# Patient Record
Sex: Female | Born: 1950
Health system: Southern US, Community
[De-identification: ages and names within clinical notes are randomized; demographics above are authoritative.]

## PROBLEM LIST (undated history)

## (undated) DIAGNOSIS — E785 Hyperlipidemia, unspecified: Secondary | ICD-10-CM

## (undated) DIAGNOSIS — M1990 Unspecified osteoarthritis, unspecified site: Secondary | ICD-10-CM

## (undated) DIAGNOSIS — I10 Essential (primary) hypertension: Secondary | ICD-10-CM

## (undated) HISTORY — PX: COLONOSCOPY: SHX174

## (undated) HISTORY — PX: TUBAL LIGATION: SHX77

## (undated) HISTORY — PX: WISDOM TOOTH EXTRACTION: SHX21

---

## 1998-11-29 ENCOUNTER — Emergency Department (HOSPITAL_COMMUNITY): Admission: EM | Admit: 1998-11-29 | Discharge: 1998-11-29 | Payer: Self-pay | Admitting: Emergency Medicine

## 2000-05-06 ENCOUNTER — Emergency Department (HOSPITAL_COMMUNITY): Admission: EM | Admit: 2000-05-06 | Discharge: 2000-05-06 | Payer: Self-pay | Admitting: Emergency Medicine

## 2000-09-14 ENCOUNTER — Emergency Department (HOSPITAL_COMMUNITY): Admission: EM | Admit: 2000-09-14 | Discharge: 2000-09-14 | Payer: Self-pay | Admitting: Emergency Medicine

## 2003-06-30 ENCOUNTER — Emergency Department (HOSPITAL_COMMUNITY): Admission: EM | Admit: 2003-06-30 | Discharge: 2003-06-30 | Payer: Self-pay | Admitting: Emergency Medicine

## 2004-05-01 ENCOUNTER — Emergency Department (HOSPITAL_COMMUNITY): Admission: EM | Admit: 2004-05-01 | Discharge: 2004-05-01 | Payer: Self-pay | Admitting: Family Medicine

## 2008-11-29 ENCOUNTER — Inpatient Hospital Stay (HOSPITAL_COMMUNITY): Admission: AD | Admit: 2008-11-29 | Discharge: 2008-11-29 | Payer: Self-pay | Admitting: Obstetrics & Gynecology

## 2008-12-14 ENCOUNTER — Emergency Department (HOSPITAL_COMMUNITY): Admission: EM | Admit: 2008-12-14 | Discharge: 2008-12-14 | Payer: Self-pay | Admitting: Emergency Medicine

## 2009-08-21 ENCOUNTER — Emergency Department (HOSPITAL_COMMUNITY): Admission: EM | Admit: 2009-08-21 | Discharge: 2009-08-21 | Payer: Self-pay | Admitting: Family Medicine

## 2010-03-07 ENCOUNTER — Encounter: Payer: Self-pay | Admitting: Internal Medicine

## 2010-05-03 ENCOUNTER — Emergency Department (HOSPITAL_COMMUNITY)
Admission: EM | Admit: 2010-05-03 | Discharge: 2010-05-03 | Disposition: A | Discharge status: Home / Self Care | Payer: Self-pay | Attending: Emergency Medicine | Admitting: Emergency Medicine

## 2010-05-03 DIAGNOSIS — I10 Essential (primary) hypertension: Secondary | ICD-10-CM | POA: Insufficient documentation

## 2010-05-03 DIAGNOSIS — R197 Diarrhea, unspecified: Secondary | ICD-10-CM | POA: Insufficient documentation

## 2010-05-03 DIAGNOSIS — I2510 Atherosclerotic heart disease of native coronary artery without angina pectoris: Secondary | ICD-10-CM | POA: Insufficient documentation

## 2010-05-03 DIAGNOSIS — R112 Nausea with vomiting, unspecified: Secondary | ICD-10-CM | POA: Insufficient documentation

## 2010-05-03 DIAGNOSIS — E7800 Pure hypercholesterolemia, unspecified: Secondary | ICD-10-CM | POA: Insufficient documentation

## 2010-05-03 LAB — CBC
HCT: 43.1 % (ref 36.0–46.0)
MCH: 26.8 pg (ref 26.0–34.0)
MCV: 81.8 fL (ref 78.0–100.0)
Platelets: 170 10*3/uL (ref 150–400)
RDW: 13.6 % (ref 11.5–15.5)

## 2010-05-03 LAB — DIFFERENTIAL
Eosinophils Absolute: 0.1 10*3/uL (ref 0.0–0.7)
Eosinophils Relative: 2 % (ref 0–5)
Lymphs Abs: 2.4 10*3/uL (ref 0.7–4.0)
Monocytes Absolute: 0.4 10*3/uL (ref 0.1–1.0)
Monocytes Relative: 6 % (ref 3–12)

## 2010-05-03 LAB — URINALYSIS, ROUTINE W REFLEX MICROSCOPIC
Glucose, UA: NEGATIVE mg/dL
Protein, ur: NEGATIVE mg/dL
Specific Gravity, Urine: 1.02 (ref 1.005–1.030)
Urobilinogen, UA: 1 mg/dL (ref 0.0–1.0)

## 2010-05-03 LAB — COMPREHENSIVE METABOLIC PANEL
AST: 57 U/L — ABNORMAL HIGH (ref 0–37)
Albumin: 4.1 g/dL (ref 3.5–5.2)
BUN: 7 mg/dL (ref 6–23)
Calcium: 9.3 mg/dL (ref 8.4–10.5)
Creatinine, Ser: 0.78 mg/dL (ref 0.4–1.2)
GFR calc non Af Amer: >60 mL/min (ref >60)

## 2010-05-03 LAB — URINE MICROSCOPIC-ADD ON

## 2010-05-20 LAB — URINALYSIS, ROUTINE W REFLEX MICROSCOPIC
Bilirubin Urine: NEGATIVE
Glucose, UA: NEGATIVE mg/dL
Hgb urine dipstick: NEGATIVE
Protein, ur: NEGATIVE mg/dL
Specific Gravity, Urine: 1.005 (ref 1.005–1.030)
Urobilinogen, UA: 0.2 mg/dL (ref 0.0–1.0)

## 2010-05-20 LAB — URINE MICROSCOPIC-ADD ON

## 2010-05-20 LAB — WET PREP, GENITAL: Yeast Wet Prep HPF POC: NONE SEEN

## 2010-11-03 ENCOUNTER — Other Ambulatory Visit: Payer: Self-pay | Admitting: Internal Medicine

## 2010-11-03 DIAGNOSIS — Z1231 Encounter for screening mammogram for malignant neoplasm of breast: Secondary | ICD-10-CM

## 2010-12-09 ENCOUNTER — Ambulatory Visit: Payer: Self-pay

## 2011-01-13 ENCOUNTER — Inpatient Hospital Stay: Admission: RE | Admit: 2011-01-13 | Payer: Self-pay | Source: Ambulatory Visit

## 2011-11-01 ENCOUNTER — Emergency Department (HOSPITAL_COMMUNITY)
Admission: EM | Admit: 2011-11-01 | Discharge: 2011-11-01 | Disposition: A | Discharge status: Home / Self Care | Payer: BC Managed Care – PPO | Source: Home / Self Care | Attending: Family Medicine | Admitting: Family Medicine

## 2011-11-01 ENCOUNTER — Encounter (HOSPITAL_COMMUNITY): Payer: Self-pay | Admitting: *Deleted

## 2011-11-01 ENCOUNTER — Emergency Department (INDEPENDENT_AMBULATORY_CARE_PROVIDER_SITE_OTHER): Payer: BC Managed Care – PPO

## 2011-11-01 DIAGNOSIS — M25511 Pain in right shoulder: Secondary | ICD-10-CM

## 2011-11-01 DIAGNOSIS — M25519 Pain in unspecified shoulder: Secondary | ICD-10-CM

## 2011-11-01 MED ORDER — MELOXICAM 7.5 MG PO TABS: 7.5000 mg | ORAL_TABLET | Freq: Every day | ORAL | Status: DC

## 2011-11-01 NOTE — ED Notes (Signed)
Pt  Reports  r  Shoulder  Pain  Off  And  On  For  Several  Months  Became  Worse  Last  Pm  She  Reports  The pain  Is  Pretty  Much  Constant     She  denys any  specefic  Injury        She    Reports  The pain  Is  releived  Sometimes  By otc  Motrin     She  Is  Sitting  Upright on  Exam table  Speaking     In  Complete sentances                Appears  In  No  Severe  Distress

## 2011-11-01 NOTE — ED Provider Notes (Signed)
History     CSN: [id number]  Arrival date & time 11/01/11  1641   None     Chief Complaint  Patient presents with  . Shoulder Pain    (Consider location/radiation/quality/duration/timing/severity/associated sxs/prior treatment) Patient is a 61 y.o. female presenting with shoulder pain. The history is provided by the patient.  Shoulder Pain This is a new problem. The current episode started 2 days ago. The problem occurs daily. The problem has been gradually worsening. The symptoms are relieved by NSAIDs. The treatment provided no relief.   [name] is a 61 y.o. female who complains of left shoulder achy pain for 2 days. Mechanism of injury: none known, works at [employer] in the [employer] at [employer].  Reports onset of gradual pain.  Pain is worse with movement; noted to be worse during day, denies change in arm function or strength.  Applied heat to area and took advil with minimal relief.  Symptoms have been increasingly worse since that time radiating down upper arm, denies paresthesias. No prior history of related problems with shoulder.  Patient denies neuromuscular disorders.     History reviewed. No pertinent past medical history.  Past Surgical History  Procedure Date  . Cesarean section     No family history on file.  History  Substance Use Topics  . Smoking status: Never Smoker   . Smokeless tobacco: Not on file  . Alcohol Use: Yes     rarely    OB History    Grav Para Term Preterm Abortions TAB SAB Ect Mult Living                  Review of Systems  Constitutional: Negative.   Respiratory: Negative.   Cardiovascular: Negative.   Musculoskeletal: Positive for arthralgias. Negative for myalgias, back pain, joint swelling and gait problem.  Skin: Negative.   Neurological: Negative.     Allergies  Review of patient's allergies indicates not on file.  Home Medications   Current Outpatient Rx  Name Route Sig Dispense Refill  . ADVIL PO  Oral Take by mouth.    . MELOXICAM 7.5 MG PO TABS Oral Take 1 tablet (7.5 mg total) by mouth daily. 30 tablet 1    BP 139/81  Pulse 80  Temp 99.1 F (37.3 C) (Oral)  Resp 18  SpO2 100%  Physical Exam  Nursing note and vitals reviewed. Constitutional: She is oriented to person, place, and time. Vital signs are normal. She appears well-developed and well-nourished. She is active and cooperative.  HENT:  Head: Normocephalic.  Eyes: Conjunctivae normal are normal. Pupils are equal, round, and reactive to light. No scleral icterus.  Neck: Trachea normal. Neck supple.  Cardiovascular: Normal rate, regular rhythm and normal heart sounds.   Pulmonary/Chest: Effort normal and breath sounds normal. No respiratory distress.  Musculoskeletal:       Right shoulder: She exhibits tenderness.       Left shoulder: Normal.       Right elbow: Normal.      Right wrist: Normal.       Cervical back: Normal.       Back:       right shoulder with ROM normal, Drop test normal, Tenderness over right trapezius and rhomboid muscle group, shoulder joint tenderness, clavicle NT, A/C joint tender, scapula tender, proximal humerus tender, shoulder joint tender; Motor strength normal, Sensation intact over deltoid region, distal sensation intact in fingers, no instability with abduction/external rotation.  Lymphadenopathy:    She has no cervical adenopathy.  Neurological: She is alert and oriented to person, place, and time. She has normal strength. No cranial nerve deficit or sensory deficit. GCS eye subscore is 4. GCS verbal subscore is 5. GCS motor subscore is 6.  Skin: Skin is warm and dry.  Psychiatric: She has a normal mood and affect. Her speech is normal and behavior is normal. Judgment and thought content normal. Cognition and memory are normal.    ED Course  Procedures (including critical care time)  Labs Reviewed - No data to display Dg Shoulder Right  11/01/2011  *RADIOLOGY REPORT*  Clinical  Data: Right shoulder pain.  RIGHT SHOULDER - 2+ VIEW  Comparison: None.  Findings: No fracture, dislocation, or acute bony finding. The acromial undersurface is type 2 (curved).  IMPRESSION:  1.  No significant abnormality identified.   Original Report Authenticated By: Dellia Cloud, M.D.      1. Right shoulder pain       MDM  Take medication as prescribed, no heavy lifting for one week.        Johnsie Kindred, NP 11/03/11 1218

## 2011-11-11 NOTE — ED Provider Notes (Signed)
Medical screening examination/treatment/procedure(s) were performed by resident physician or non-physician practitioner and as supervising physician I was immediately available for consultation/collaboration.   Barkley Bruns MD.    Linna Hoff, MD 11/11/11 [phone]

## 2012-04-21 ENCOUNTER — Encounter (HOSPITAL_COMMUNITY): Payer: Self-pay

## 2012-04-21 ENCOUNTER — Emergency Department (HOSPITAL_COMMUNITY)
Admission: EM | Admit: 2012-04-21 | Discharge: 2012-04-21 | Disposition: A | Discharge status: Home / Self Care | Payer: BC Managed Care – PPO | Attending: Emergency Medicine | Admitting: Emergency Medicine

## 2012-04-21 DIAGNOSIS — M129 Arthropathy, unspecified: Secondary | ICD-10-CM | POA: Insufficient documentation

## 2012-04-21 DIAGNOSIS — Z79899 Other long term (current) drug therapy: Secondary | ICD-10-CM | POA: Insufficient documentation

## 2012-04-21 DIAGNOSIS — H9319 Tinnitus, unspecified ear: Secondary | ICD-10-CM | POA: Insufficient documentation

## 2012-04-21 DIAGNOSIS — Z760 Encounter for issue of repeat prescription: Secondary | ICD-10-CM | POA: Insufficient documentation

## 2012-04-21 DIAGNOSIS — Z8659 Personal history of other mental and behavioral disorders: Secondary | ICD-10-CM | POA: Insufficient documentation

## 2012-04-21 DIAGNOSIS — I10 Essential (primary) hypertension: Secondary | ICD-10-CM | POA: Insufficient documentation

## 2012-04-21 HISTORY — DX: Essential (primary) hypertension: I10

## 2012-04-21 HISTORY — DX: Unspecified osteoarthritis, unspecified site: M19.90

## 2012-04-21 MED ORDER — HYDROCHLOROTHIAZIDE 25 MG PO TABS: 25.0000 mg | ORAL_TABLET | Freq: Every day | ORAL | Status: DC

## 2012-04-21 NOTE — ED Provider Notes (Signed)
History     CSN: [id number]  Arrival date & time 04/21/12  0102   First MD Initiated Contact with Patient 04/21/12 [phone]      Chief Complaint  Patient presents with  . Hypertension    (Consider location/radiation/quality/duration/timing/severity/associated sxs/prior treatment) HPI  A 62 year old female with history of hypertension, and arthritis presents for request of medication refilled. Patient reports she stopped taking depression medication for nearly 2 months because "I do not want to take it anymore".  When asked why, pt reports she likes to stay healthy and not dependent on medication.  She has tried diet and exercise.  She's here today because she has ringing in her L ear.  Sts she used to have ringing in ear prior to being on HCTZ.  Once she was on the medication for over a year, her ringing stop.  It has resumed within the past month.  Ringing is intermittent, lasting for mins.  Endorse mild intermittent headache affecting forehead.  NO active headache or ringing ear. Sts she hasn't been exercise or eat as healthy as she wants. Denies fever, chills, denies vision changes, chest pain, shortness of breath, back pain, abdominal pain, dysuria, numbness, weakness. She denies calf pain or leg swelling. She denies taking any NSAIDs for a prolonged period of time. She denies any other medication changes. She is a nonsmoker.  Past Medical History  Diagnosis Date  . Hypertension   . Arthritis     History reviewed. No pertinent past surgical history.  No family history on file.  History  Substance Use Topics  . Smoking status: Never Smoker   . Smokeless tobacco: Not on file  . Alcohol Use: Yes     Comment: rarely    OB History   Grav Para Term Preterm Abortions TAB SAB Ect Mult Living                  Review of Systems  Constitutional:       10 Systems reviewed and all are negative for acute change except as noted in the HPI.     Allergies  Review of patient's  allergies indicates no known allergies.  Home Medications   Current Outpatient Rx  Name  Route  Sig  Dispense  Refill  . Ibuprofen (ADVIL PO)   Oral   Take by mouth.         . meloxicam (MOBIC) 7.5 MG tablet   Oral   Take 1 tablet (7.5 mg total) by mouth daily.   30 tablet   1     BP 157/94  Pulse 71  Temp(Src) 98 F (36.7 C) (Oral)  Resp 20  SpO2 99%  Physical Exam  Nursing note and vitals reviewed. Constitutional: She appears well-developed and well-nourished. No distress.  Awake, alert, nontoxic appearance  HENT:  Head: Atraumatic.  Right Ear: External ear normal.  Left Ear: External ear normal.  Nose: Nose normal.  Mouth/Throat: Oropharynx is clear and moist.  Eyes: Conjunctivae are normal. Right eye exhibits no discharge. Left eye exhibits no discharge.  Neck: Neck supple.  Cardiovascular: Normal rate, regular rhythm and intact distal pulses.  Exam reveals no gallop and no friction rub.   No murmur heard. Pulmonary/Chest: Effort normal. No respiratory distress. She exhibits no tenderness.  Abdominal: Soft. There is no tenderness. There is no rebound.  Musculoskeletal: Normal range of motion. She exhibits no edema and no tenderness.  ROM appears intact, no obvious focal weakness  Neurological:  Mental status and  motor strength appears intact  Skin: No rash noted.  Psychiatric: She has a normal mood and affect.    ED Course  Procedures (including critical care time)  Labs Reviewed - No data to display No results found.   1. Encounter for medication refill     10:04 AM Patient is here for medication refill, hydrochlorothiazide 25 mg. She does complain of intermittent ringing in ear however none recently and her ear exam is unremarkable. Her blood pressure today is 157/94. I do not suspect hypertensive urgency or emergency at this time. She is alert and oriented without any significant discomfort. Will refill her hydrochlorothiazide and recommend patient  to followup closely with the PCP for further management of her disease. I encourage continue with diet and exercise.  BP 157/94  Pulse 71  Temp(Src) 98 F (36.7 C) (Oral)  Resp 20  SpO2 99%   MDM          Fayrene Helper, PA-C 04/21/12 1009

## 2012-04-21 NOTE — ED Notes (Signed)
Pt states she stopped taking her BP meds 6 to 8 weeks ago because she doesn't want to take them anymore.  Pt states that since she has stopped taking her BP meds she has noticed intermittent ear ringing.  Pt c/o high BP.  Pt's BP upon arrival here 157/94.

## 2012-04-21 NOTE — ED Provider Notes (Signed)
Medical screening examination/treatment/procedure(s) were performed by non-physician practitioner and as supervising physician I was immediately available for consultation/collaboration.  Doug Sou, MD 04/21/12 1650

## 2013-03-15 ENCOUNTER — Other Ambulatory Visit: Payer: Self-pay | Admitting: Obstetrics and Gynecology

## 2013-03-19 ENCOUNTER — Encounter (HOSPITAL_COMMUNITY): Payer: Self-pay | Admitting: Pharmacist

## 2013-03-27 ENCOUNTER — Encounter (HOSPITAL_COMMUNITY): Payer: Self-pay

## 2013-03-27 ENCOUNTER — Other Ambulatory Visit: Payer: Self-pay

## 2013-03-27 ENCOUNTER — Encounter (HOSPITAL_COMMUNITY)
Admission: RE | Admit: 2013-03-27 | Discharge: 2013-03-27 | Disposition: A | Discharge status: Home / Self Care | Payer: BC Managed Care – PPO | Source: Ambulatory Visit | Attending: Obstetrics and Gynecology | Admitting: Obstetrics and Gynecology

## 2013-03-27 ENCOUNTER — Other Ambulatory Visit: Payer: Self-pay | Admitting: Obstetrics and Gynecology

## 2013-03-27 DIAGNOSIS — Z01812 Encounter for preprocedural laboratory examination: Secondary | ICD-10-CM | POA: Insufficient documentation

## 2013-03-27 DIAGNOSIS — Z01810 Encounter for preprocedural cardiovascular examination: Secondary | ICD-10-CM | POA: Insufficient documentation

## 2013-03-27 HISTORY — DX: Hyperlipidemia, unspecified: E78.5

## 2013-03-27 LAB — BASIC METABOLIC PANEL
BUN: 14 mg/dL (ref 6–23)
CHLORIDE: 100 meq/L (ref 96–112)
CO2: 30 mEq/L (ref 19–32)
Calcium: 9.2 mg/dL (ref 8.4–10.5)
Creatinine, Ser: 0.63 mg/dL (ref 0.50–1.10)
GFR calc Af Amer: >90 mL/min (ref >90)
GLUCOSE: 93 mg/dL (ref 70–99)
POTASSIUM: 3.5 meq/L — AB (ref 3.7–5.3)
Sodium: 140 mEq/L (ref 137–147)

## 2013-03-27 LAB — CBC
HEMATOCRIT: 38.6 % (ref 36.0–46.0)
HEMOGLOBIN: 12.7 g/dL (ref 12.0–15.0)
MCH: 26.3 pg (ref 26.0–34.0)
MCHC: 32.9 g/dL (ref 30.0–36.0)
MCV: 80.1 fL (ref 78.0–100.0)
Platelets: 195 10*3/uL (ref 150–400)
RBC: 4.82 MIL/uL (ref 3.87–5.11)
RDW: 14.3 % (ref 11.5–15.5)
WBC: 6.4 10*3/uL (ref 4.0–10.5)

## 2013-03-27 NOTE — Patient Instructions (Signed)
   Your procedure is scheduled on: Monday, Feb 16  Enter through the [employer] [facility] at: [place] up the phone at the desk and dial [phone] and inform us of your arrival.  Please call this number if you have any problems the morning of surgery: [phone]  Remember: Do not eat food after midnight: Sunday Do not drink clear liquids after:  9 AM Monday, day of surgery Take these medicines the morning of surgery with a SIP OF WATER:  HCTZ, crestor  Do not wear jewelry, make-up, or FINGER nail polish No metal in your hair or on your body. Do not wear lotions, powders, perfumes.  You may wear deodorant.  Do not bring valuables to the hospital. Contacts, dentures or bridgework may not be worn into surgery.  Leave suitcase in the car. After Surgery it may be brought to your room. For patients being admitted to the hospital, checkout time is 11:00am the day of discharge.  Home with daughter Laurenashley Viar cell [phone]

## 2013-03-31 NOTE — H&P (Signed)
[name], [name]             ACCOUNT NO.:  [id number]  MEDICAL RECORD NO.:  [id number]  LOCATION:  SDC                           FACILITY:  [facility]  PHYSICIAN:  Lovenia Kim, M.D.DATE OF BIRTH:  Mar 22, 1950  DATE OF ADMISSION:  03/27/2013 DATE OF DISCHARGE:  03/27/2013                             HISTORY & PHYSICAL   CHIEF COMPLAINT:  Symptomatic pelvic relaxation and hyperplasia without atypia.  HISTORY OF PRESENT ILLNESS:  This is a 63 year old African American female, G6 P5 presents with aforementioned indications for surgery.  ALLERGIES:  She has no known drug allergies.  MEDICATIONS:  Estrace cream, hydrochlorothiazide, Crestor, and vitamin D3.  PAST MEDICAL HISTORY:  Hypertension, hypercholesterolemia, uterine prolapse, cystocele.  PREGNANCY HISTORY:  Five vaginal deliveries, 1 abortion.  PAST MEDICAL HISTORY:  Tubal ligation.  FAMILY HISTORY:  Diabetes, heart disease, hypertension.  PHYSICAL EXAMINATION:  GENERAL:  A well-developed, well-nourished, African American female, in no acute distress. HEENT:  Normal. NECK:  Supple.  Full range of motion. LUNGS:  Clear. HEART:  Regular rate and rhythm. ABDOMEN:  Soft, nontender. PELVIC:  Reveals the uterus to be anteflexed and slightly globular in nature with no adnexal masses appreciated.Gr one uterine descensus and grade 1-2 cystocele. EXTREMITIES:  No cords. NEUROLOGIC:  Nonfocal. SKIN:  Intact.  LABORATORY DATA:  Reveals an ultrasound which noted a 20 mm endometrium; therefore, in the absence of postmenopausal bleeding an endometrial biopsy was performed with known simple hyperplasia with no atypia.  IMPRESSION: 1. Symptomatic pelvic prolapse with cystocele, enterocele, and uterine     Prolapse. Failed pessary trial. 2. Simple endometrial hyperplasia with no atypia.  PLAN:  Proceed with total vaginal hysterectomy, anterior colporrhaphy, enterocele repair.  Risks of anesthesia, infection, bleeding, injury  to surrounding organs with need for repair is discussed, delayed versus immediate complications to include bowel, bladder injury noted.  The patient acknowledges and wishes to proceed with surgery.     Lovenia Kim, M.D.     RJT/MEDQ  D:  03/31/2013  T:  03/31/2013  Job:  [id number]

## 2013-04-01 ENCOUNTER — Encounter (HOSPITAL_COMMUNITY): Payer: BC Managed Care – PPO | Admitting: Anesthesiology

## 2013-04-01 ENCOUNTER — Ambulatory Visit (HOSPITAL_COMMUNITY)
Admission: RE | Admit: 2013-04-01 | Discharge: 2013-04-02 | Disposition: A | Discharge status: Home / Self Care | Payer: BC Managed Care – PPO | Source: Ambulatory Visit | Attending: Obstetrics and Gynecology | Admitting: Obstetrics and Gynecology

## 2013-04-01 ENCOUNTER — Encounter (HOSPITAL_COMMUNITY)
Admission: RE | Disposition: A | Discharge status: Home / Self Care | Payer: Self-pay | Source: Ambulatory Visit | Attending: Obstetrics and Gynecology

## 2013-04-01 ENCOUNTER — Ambulatory Visit (HOSPITAL_COMMUNITY): Payer: BC Managed Care – PPO | Admitting: Anesthesiology

## 2013-04-01 ENCOUNTER — Encounter (HOSPITAL_COMMUNITY): Payer: Self-pay | Admitting: Anesthesiology

## 2013-04-01 DIAGNOSIS — N8189 Other female genital prolapse: Secondary | ICD-10-CM | POA: Diagnosis present

## 2013-04-01 DIAGNOSIS — N72 Inflammatory disease of cervix uteri: Secondary | ICD-10-CM | POA: Insufficient documentation

## 2013-04-01 DIAGNOSIS — D251 Intramural leiomyoma of uterus: Secondary | ICD-10-CM | POA: Insufficient documentation

## 2013-04-01 DIAGNOSIS — N815 Vaginal enterocele: Secondary | ICD-10-CM | POA: Insufficient documentation

## 2013-04-01 DIAGNOSIS — I10 Essential (primary) hypertension: Secondary | ICD-10-CM | POA: Insufficient documentation

## 2013-04-01 DIAGNOSIS — N8000 Endometriosis of the uterus, unspecified: Secondary | ICD-10-CM | POA: Insufficient documentation

## 2013-04-01 DIAGNOSIS — N841 Polyp of cervix uteri: Secondary | ICD-10-CM | POA: Insufficient documentation

## 2013-04-01 DIAGNOSIS — N840 Polyp of corpus uteri: Secondary | ICD-10-CM | POA: Insufficient documentation

## 2013-04-01 DIAGNOSIS — N812 Incomplete uterovaginal prolapse: Principal | ICD-10-CM | POA: Insufficient documentation

## 2013-04-01 DIAGNOSIS — N318 Other neuromuscular dysfunction of bladder: Secondary | ICD-10-CM | POA: Insufficient documentation

## 2013-04-01 HISTORY — PX: CYSTOCELE REPAIR: SHX163

## 2013-04-01 HISTORY — PX: VAGINAL HYSTERECTOMY: SHX2639

## 2013-04-01 HISTORY — PX: RECTOCELE REPAIR: SHX761

## 2013-04-01 SURGERY — COLPORRHAPHY, ANTERIOR, FOR CYSTOCELE REPAIR
Anesthesia: General

## 2013-04-01 MED ORDER — LACTATED RINGERS IV SOLN
INTRAVENOUS | Status: DC
  Administered 2013-04-01 (×2): via INTRAVENOUS

## 2013-04-01 MED ORDER — LABETALOL HCL 5 MG/ML IV SOLN
INTRAVENOUS | Status: AC
  Filled 2013-04-01: qty 4

## 2013-04-01 MED ORDER — NEOSTIGMINE METHYLSULFATE 1 MG/ML IJ SOLN
INTRAMUSCULAR | Status: DC | PRN
  Administered 2013-04-01: 2 mg via INTRAVENOUS

## 2013-04-01 MED ORDER — SODIUM CHLORIDE 0.9 % IJ SOLN
9.0000 mL | INTRAMUSCULAR | Status: DC | PRN
Start: 2013-04-01 — End: 2013-04-02

## 2013-04-01 MED ORDER — LABETALOL HCL 5 MG/ML IV SOLN
INTRAVENOUS | Status: DC | PRN
  Administered 2013-04-01: 5 mg via INTRAVENOUS

## 2013-04-01 MED ORDER — ONDANSETRON HCL 4 MG/2ML IJ SOLN
INTRAMUSCULAR | Status: AC
  Filled 2013-04-01: qty 2

## 2013-04-01 MED ORDER — DIPHENHYDRAMINE HCL 12.5 MG/5ML PO ELIX
12.5000 mg | ORAL_SOLUTION | Freq: Four times a day (QID) | ORAL | Status: DC | PRN
Start: 2013-04-01 — End: 2013-04-02

## 2013-04-01 MED ORDER — HYDROCHLOROTHIAZIDE 12.5 MG PO CAPS
12.5000 mg | ORAL_CAPSULE | Freq: Every day | ORAL | Status: DC
  Administered 2013-04-02: 12.5 mg via ORAL
  Filled 2013-04-01: qty 1

## 2013-04-01 MED ORDER — METOCLOPRAMIDE HCL 5 MG/ML IJ SOLN
10.0000 mg | Freq: Once | INTRAMUSCULAR | Status: AC | PRN
  Administered 2013-04-01: 10 mg via INTRAVENOUS

## 2013-04-01 MED ORDER — HYDROMORPHONE HCL PF 1 MG/ML IJ SOLN
INTRAMUSCULAR | Status: AC
  Administered 2013-04-01: 0.25 mg via INTRAVENOUS
  Filled 2013-04-01: qty 1

## 2013-04-01 MED ORDER — HYDROMORPHONE 0.3 MG/ML IV SOLN
INTRAVENOUS | Status: DC
  Administered 2013-04-01: 18:00:00 via INTRAVENOUS
  Administered 2013-04-01: 0.4 mg via INTRAVENOUS
  Administered 2013-04-02: 0.399 mg via INTRAVENOUS
  Administered 2013-04-02: 0.4 mg via INTRAVENOUS
  Filled 2013-04-01: qty 25

## 2013-04-01 MED ORDER — PROPOFOL 10 MG/ML IV BOLUS
INTRAVENOUS | Status: DC | PRN
  Administered 2013-04-01: 120 mg via INTRAVENOUS
  Administered 2013-04-01: 30 mg via INTRAVENOUS

## 2013-04-01 MED ORDER — MIDAZOLAM HCL 2 MG/2ML IJ SOLN
INTRAMUSCULAR | Status: DC | PRN
  Administered 2013-04-01: 1 mg via INTRAVENOUS

## 2013-04-01 MED ORDER — TRAMADOL HCL 50 MG PO TABS: 50.0000 mg | ORAL_TABLET | Freq: Four times a day (QID) | ORAL | Status: DC | PRN

## 2013-04-01 MED ORDER — KETOROLAC TROMETHAMINE 30 MG/ML IJ SOLN
INTRAMUSCULAR | Status: AC
  Filled 2013-04-01: qty 1

## 2013-04-01 MED ORDER — FENTANYL CITRATE 0.05 MG/ML IJ SOLN
INTRAMUSCULAR | Status: AC
  Filled 2013-04-01: qty 2

## 2013-04-01 MED ORDER — ONDANSETRON HCL 4 MG/2ML IJ SOLN
4.0000 mg | Freq: Once | INTRAMUSCULAR | Status: AC
  Administered 2013-04-01: 4 mg via INTRAVENOUS

## 2013-04-01 MED ORDER — VASOPRESSIN 20 UNIT/ML IJ SOLN
INTRAVENOUS | Status: DC | PRN
  Administered 2013-04-01: 14:00:00 via INTRAMUSCULAR

## 2013-04-01 MED ORDER — NEOSTIGMINE METHYLSULFATE 1 MG/ML IJ SOLN
INTRAMUSCULAR | Status: AC
  Filled 2013-04-01: qty 1

## 2013-04-01 MED ORDER — DEXAMETHASONE SODIUM PHOSPHATE 10 MG/ML IJ SOLN
INTRAMUSCULAR | Status: DC | PRN
  Administered 2013-04-01: 10 mg via INTRAVENOUS

## 2013-04-01 MED ORDER — LIDOCAINE HCL (CARDIAC) 20 MG/ML IV SOLN
INTRAVENOUS | Status: DC | PRN
  Administered 2013-04-01: 50 mg via INTRAVENOUS

## 2013-04-01 MED ORDER — ONDANSETRON HCL 4 MG/2ML IJ SOLN: 4.0000 mg | Freq: Four times a day (QID) | INTRAMUSCULAR | Status: DC | PRN

## 2013-04-01 MED ORDER — ONDANSETRON HCL 4 MG/2ML IJ SOLN
INTRAMUSCULAR | Status: DC | PRN
  Administered 2013-04-01: 4 mg via INTRAVENOUS

## 2013-04-01 MED ORDER — NALOXONE HCL 0.4 MG/ML IJ SOLN: 0.4000 mg | INTRAMUSCULAR | Status: DC | PRN

## 2013-04-01 MED ORDER — HYDROMORPHONE HCL PF 1 MG/ML IJ SOLN
0.2500 mg | INTRAMUSCULAR | Status: DC | PRN
  Administered 2013-04-01: 0.5 mg via INTRAVENOUS
  Administered 2013-04-01 (×2): 0.25 mg via INTRAVENOUS
  Administered 2013-04-01: 0.5 mg via INTRAVENOUS

## 2013-04-01 MED ORDER — PROPOFOL 10 MG/ML IV EMUL
INTRAVENOUS | Status: AC
  Filled 2013-04-01: qty 20

## 2013-04-01 MED ORDER — ESTRADIOL 0.1 MG/GM VA CREA
TOPICAL_CREAM | VAGINAL | Status: AC
  Filled 2013-04-01: qty 42.5

## 2013-04-01 MED ORDER — CEFAZOLIN SODIUM-DEXTROSE 2-3 GM-% IV SOLR
2.0000 g | INTRAVENOUS | Status: AC
  Administered 2013-04-01: 2 g via INTRAVENOUS

## 2013-04-01 MED ORDER — ONDANSETRON HCL 4 MG/2ML IJ SOLN
INTRAMUSCULAR | Status: AC
  Administered 2013-04-01: 4 mg via INTRAVENOUS
  Filled 2013-04-01: qty 2

## 2013-04-01 MED ORDER — DEXTROSE IN LACTATED RINGERS 5 % IV SOLN
INTRAVENOUS | Status: DC
  Administered 2013-04-01 – 2013-04-02 (×2): via INTRAVENOUS

## 2013-04-01 MED ORDER — FENTANYL CITRATE 0.05 MG/ML IJ SOLN
INTRAMUSCULAR | Status: DC | PRN
  Administered 2013-04-01 (×3): 50 ug via INTRAVENOUS
  Administered 2013-04-01: 150 ug via INTRAVENOUS
  Administered 2013-04-01: 100 ug via INTRAVENOUS

## 2013-04-01 MED ORDER — SODIUM CHLORIDE 0.9 % IJ SOLN
INTRAMUSCULAR | Status: AC
  Filled 2013-04-01: qty 100

## 2013-04-01 MED ORDER — GLYCOPYRROLATE 0.2 MG/ML IJ SOLN
INTRAMUSCULAR | Status: AC
  Filled 2013-04-01: qty 2

## 2013-04-01 MED ORDER — METOCLOPRAMIDE HCL 5 MG/ML IJ SOLN
INTRAMUSCULAR | Status: AC
  Administered 2013-04-01: 10 mg
  Filled 2013-04-01: qty 2

## 2013-04-01 MED ORDER — KETOROLAC TROMETHAMINE 30 MG/ML IJ SOLN
INTRAMUSCULAR | Status: DC | PRN
  Administered 2013-04-01: 30 mg via INTRAVENOUS

## 2013-04-01 MED ORDER — GLYCOPYRROLATE 0.2 MG/ML IJ SOLN
INTRAMUSCULAR | Status: DC | PRN
  Administered 2013-04-01: 0.4 mg via INTRAVENOUS

## 2013-04-01 MED ORDER — ROCURONIUM BROMIDE 100 MG/10ML IV SOLN
INTRAVENOUS | Status: DC | PRN
  Administered 2013-04-01: 30 mg via INTRAVENOUS

## 2013-04-01 MED ORDER — DIPHENHYDRAMINE HCL 50 MG/ML IJ SOLN: 12.5000 mg | Freq: Four times a day (QID) | INTRAMUSCULAR | Status: DC | PRN

## 2013-04-01 MED ORDER — HYDROMORPHONE HCL PF 1 MG/ML IJ SOLN
INTRAMUSCULAR | Status: AC
  Filled 2013-04-01: qty 1

## 2013-04-01 MED ORDER — OXYCODONE-ACETAMINOPHEN 5-325 MG PO TABS
1.0000 | ORAL_TABLET | ORAL | Status: DC | PRN
  Administered 2013-04-02: 1 via ORAL
  Filled 2013-04-01: qty 1

## 2013-04-01 MED ORDER — FENTANYL CITRATE 0.05 MG/ML IJ SOLN
INTRAMUSCULAR | Status: AC
  Filled 2013-04-01: qty 5

## 2013-04-01 MED ORDER — LIDOCAINE HCL (CARDIAC) 20 MG/ML IV SOLN
INTRAVENOUS | Status: AC
  Filled 2013-04-01: qty 5

## 2013-04-01 MED ORDER — MIDAZOLAM HCL 2 MG/2ML IJ SOLN
INTRAMUSCULAR | Status: AC
  Filled 2013-04-01: qty 2

## 2013-04-01 MED ORDER — VASOPRESSIN 20 UNIT/ML IJ SOLN
INTRAMUSCULAR | Status: AC
  Filled 2013-04-01: qty 1

## 2013-04-01 MED ORDER — DEXAMETHASONE SODIUM PHOSPHATE 10 MG/ML IJ SOLN
INTRAMUSCULAR | Status: AC
  Filled 2013-04-01: qty 1

## 2013-04-01 MED ORDER — MEPERIDINE HCL 25 MG/ML IJ SOLN: 6.2500 mg | INTRAMUSCULAR | Status: DC | PRN

## 2013-04-01 SURGICAL SUPPLY — 33 items
CANISTER SUCT 3000ML (MISCELLANEOUS) ×2 IMPLANT
CLOTH BEACON ORANGE TIMEOUT ST (SAFETY) ×2 IMPLANT
CONT PATH 16OZ SNAP LID 3702 (MISCELLANEOUS) IMPLANT
CONTAINER PREFILL 10% NBF 60ML (FORM) IMPLANT
DECANTER SPIKE VIAL GLASS SM (MISCELLANEOUS) ×2 IMPLANT
DRSG TELFA 3X8 NADH (GAUZE/BANDAGES/DRESSINGS) ×2 IMPLANT
ELECT NDL TIP 2.8 STRL (NEEDLE) IMPLANT
ELECT NEEDLE TIP 2.8 STRL (NEEDLE) IMPLANT
GLOVE BIO SURGEON STRL SZ 6.5 (GLOVE) ×2 IMPLANT
GLOVE BIO SURGEON STRL SZ7.5 (GLOVE) ×2 IMPLANT
GLOVE BIOGEL PI IND STRL 6.5 (GLOVE) ×1 IMPLANT
GLOVE BIOGEL PI IND STRL 7.0 (GLOVE) IMPLANT
GLOVE BIOGEL PI INDICATOR 6.5 (GLOVE) ×1
GLOVE BIOGEL PI INDICATOR 7.0 (GLOVE) ×1
GOWN PREVENTION PLUS XLARGE (GOWN DISPOSABLE) ×2 IMPLANT
GOWN STRL REIN XL XLG (GOWN DISPOSABLE) ×8 IMPLANT
NDL SPNL 22GX3.5 QUINCKE BK (NEEDLE) IMPLANT
NEEDLE HYPO 22GX1.5 SAFETY (NEEDLE) ×1 IMPLANT
NEEDLE SPNL 22GX3.5 QUINCKE BK (NEEDLE) ×2 IMPLANT
NS IRRIG 1000ML POUR BTL (IV SOLUTION) ×2 IMPLANT
PACK VAGINAL WOMENS (CUSTOM PROCEDURE TRAY) ×2 IMPLANT
PAD DRESSING TELFA 3X8 NADH (GAUZE/BANDAGES/DRESSINGS) ×1 IMPLANT
PAD OB MATERNITY 4.3X12.25 (PERSONAL CARE ITEMS) ×2 IMPLANT
SUT MON AB 2-0 CT2 27 (SUTURE) IMPLANT
SUT VIC AB 0 CT1 18XCR BRD8 (SUTURE) ×3 IMPLANT
SUT VIC AB 0 CT1 27 (SUTURE) ×4
SUT VIC AB 0 CT1 27XBRD ANBCTR (SUTURE) ×2 IMPLANT
SUT VIC AB 0 CT1 8-18 (SUTURE) ×12
SUT VICRYL 0 TIES 12 18 (SUTURE) ×1 IMPLANT
SUT VICRYL 3 0 CT 3 (SUTURE) IMPLANT
TOWEL OR 17X24 6PK STRL BLUE (TOWEL DISPOSABLE) ×4 IMPLANT
TRAY FOLEY CATH 14FR (SET/KITS/TRAYS/PACK) ×2 IMPLANT
WATER STERILE IRR 1000ML POUR (IV SOLUTION) ×2 IMPLANT

## 2013-04-01 NOTE — Anesthesia Preprocedure Evaluation (Signed)
Anesthesia Evaluation  Patient identified by MRN, date of birth, ID band Patient awake    Reviewed: Allergy & Precautions, H&P , NPO status , Patient's Chart, lab work & pertinent test results  Airway Mallampati: III TM Distance: >3 FB Neck ROM: Full    Dental no notable dental hx. (+) Teeth Intact   Pulmonary neg pulmonary ROS,  breath sounds clear to auscultation  Pulmonary exam normal       Cardiovascular hypertension, Pt. on medications Rhythm:Regular Rate:Normal     Neuro/Psych negative neurological ROS  negative psych ROS   GI/Hepatic negative GI ROS, Neg liver ROS,   Endo/Other  Hyperlipidemia  Renal/GU  Bladder dysfunction  Cystocele    Musculoskeletal  (+) Arthritis -,   Abdominal   Peds  Hematology negative hematology ROS (+)   Anesthesia Other Findings   Reproductive/Obstetrics Symptomatic Pelvic relaxation Rectocele/enterocele Cystocele                           Anesthesia Physical Anesthesia Plan  ASA: II  Anesthesia Plan: General   Post-op Pain Management:    Induction: Intravenous  Airway Management Planned: Oral ETT  Additional Equipment:   Intra-op Plan:   Post-operative Plan: Extubation in OR  Informed Consent: I have reviewed the patients History and Physical, chart, labs and discussed the procedure including the risks, benefits and alternatives for the proposed anesthesia with the patient or authorized representative who has indicated his/her understanding and acceptance.   Dental advisory given  Plan Discussed with: CRNA, Anesthesiologist and Surgeon  Anesthesia Plan Comments:         Anesthesia Quick Evaluation

## 2013-04-01 NOTE — Anesthesia Procedure Notes (Signed)
Procedure Name: Intubation Date/Time: 04/01/2013 1:21 PM Performed by: Jonna Munro Pre-anesthesia Checklist: Suction available, Patient being monitored, Emergency Drugs available, Patient identified and Timeout performed Patient Re-evaluated:Patient Re-evaluated prior to inductionOxygen Delivery Method: Circle system utilized Preoxygenation: Pre-oxygenation with 100% oxygen Intubation Type: IV induction Ventilation: Mask ventilation without difficulty Grade View: Grade I Tube type: Oral Tube size: 7.0 mm Number of attempts: 1 Airway Equipment and Method: Stylet Secured at: 20 cm Tube secured with: Tape Dental Injury: Teeth and Oropharynx as per pre-operative assessment

## 2013-04-01 NOTE — Progress Notes (Signed)
Patient ID: JANNAE FAGERSTROM, female   DOB: 1950-12-22, 63 y.o.   MRN: [id number] Patient seen and examined. Consent witnessed and signed. No changes noted. Update completed. CBC    Component Value Date/Time   WBC 6.4 03/27/2013 1135   RBC 4.82 03/27/2013 1135   HGB 12.7 03/27/2013 1135   HCT 38.6 03/27/2013 1135   PLT 195 03/27/2013 1135   MCV 80.1 03/27/2013 1135   MCH 26.3 03/27/2013 1135   MCHC 32.9 03/27/2013 1135   RDW 14.3 03/27/2013 1135   LYMPHSABS 2.4 05/03/2010 1530   MONOABS 0.4 05/03/2010 1530   EOSABS 0.1 05/03/2010 1530   BASOSABS 0.0 05/03/2010 1530

## 2013-04-01 NOTE — Transfer of Care (Signed)
Immediate Anesthesia Transfer of Care Note  Patient: [name]  Procedure(s) Performed: Procedure(s) with comments: ANTERIOR REPAIR (CYSTOCELE) with Enterocele Repair (N/A) - 90 min. POSTERIOR REPAIR (RECTOCELE) (N/A) HYSTERECTOMY TOTAL VAGINAL (N/A)  Patient Location: PACU  Anesthesia Type:General  Level of Consciousness: awake, alert , oriented and patient cooperative  Airway & Oxygen Therapy: Patient Spontanous Breathing and Patient connected to nasal cannula oxygen  Post-op Assessment: Report given to PACU RN and Post -op Vital signs reviewed and stable  Post vital signs: stable  Complications: No apparent anesthesia complications

## 2013-04-01 NOTE — Anesthesia Postprocedure Evaluation (Signed)
  Anesthesia Post-op Note  Patient: [name]  Procedure(s) Performed: Procedure(s) with comments: ANTERIOR REPAIR (CYSTOCELE) with Enterocele Repair (N/A) - 90 min. POSTERIOR REPAIR (RECTOCELE) (N/A) HYSTERECTOMY TOTAL VAGINAL (N/A)  Patient Location: PACU  Anesthesia Type:General  Level of Consciousness: awake, alert  and oriented  Airway and Oxygen Therapy: Patient Spontanous Breathing  Post-op Pain: mild  Post-op Assessment: Post-op Vital signs reviewed, Patient's Cardiovascular Status Stable, Respiratory Function Stable, Patent Airway, No signs of Nausea or vomiting and Pain level controlled  Post-op Vital Signs: Reviewed and stable  Complications: No apparent anesthesia complications

## 2013-04-01 NOTE — Op Note (Signed)
04/01/2013  3:30 PM  PATIENT:  [name]  63 y.o. female  PRE-OPERATIVE DIAGNOSIS:  Symptomatic Pelvic Relaxation  POST-OPERATIVE DIAGNOSIS:  Symptomatic Pelvic Relaxation  PROCEDURE:  Procedure(s): ANTERIOR REPAIR (CYSTOCELE) with Enterocele Repair POSTERIOR REPAIR (RECTOCELE) HYSTERECTOMY TOTAL VAGINAL WITH MORCELLATION LYSIS OF ADHESIONS PERINEORRAPHY MCCALL CUL DE PLASTY  SURGEON:  Surgeon(s): Lovenia Kim, MD  ASSISTANTS: Renato Battles, CNM:   ANESTHESIA:   general  ESTIMATED BLOOD LOSS: 200cc  DRAINS: Urinary Catheter (Foley)   LOCAL MEDICATIONS USED:  NONE  SPECIMEN:  Source of Specimen:  uterus , cervix  DISPOSITION OF SPECIMEN:  PATHOLOGY  COUNTS:  YES  DICTATION #: [id number]  PLAN OF CARE: DC in am  PATIENT DISPOSITION:  PACU - hemodynamically stable.

## 2013-04-02 ENCOUNTER — Encounter (HOSPITAL_COMMUNITY): Payer: Self-pay | Admitting: Obstetrics and Gynecology

## 2013-04-02 LAB — BASIC METABOLIC PANEL
BUN: 7 mg/dL (ref 6–23)
CHLORIDE: 97 meq/L (ref 96–112)
CO2: 28 mEq/L (ref 19–32)
CREATININE: 0.6 mg/dL (ref 0.50–1.10)
Calcium: 8.2 mg/dL — ABNORMAL LOW (ref 8.4–10.5)
GFR calc non Af Amer: >90 mL/min (ref >90)
Glucose, Bld: 175 mg/dL — ABNORMAL HIGH (ref 70–99)
POTASSIUM: 3.8 meq/L (ref 3.7–5.3)
Sodium: 136 mEq/L — ABNORMAL LOW (ref 137–147)

## 2013-04-02 LAB — CBC
HCT: 29.1 % — ABNORMAL LOW (ref 36.0–46.0)
Hemoglobin: 9.7 g/dL — ABNORMAL LOW (ref 12.0–15.0)
MCH: 26.6 pg (ref 26.0–34.0)
MCHC: 33.3 g/dL (ref 30.0–36.0)
MCV: 79.9 fL (ref 78.0–100.0)
PLATELETS: 166 10*3/uL (ref 150–400)
RBC: 3.64 MIL/uL — ABNORMAL LOW (ref 3.87–5.11)
RDW: 14.3 % (ref 11.5–15.5)
WBC: 12.3 10*3/uL — ABNORMAL HIGH (ref 4.0–10.5)

## 2013-04-02 MED ORDER — OXYCODONE-ACETAMINOPHEN 5-325 MG PO TABS: 1.0000 | ORAL_TABLET | ORAL | Status: DC | PRN

## 2013-04-02 MED ORDER — TRAMADOL HCL 50 MG PO TABS: 50.0000 mg | ORAL_TABLET | Freq: Four times a day (QID) | ORAL | Status: DC | PRN

## 2013-04-02 NOTE — Op Note (Signed)
[name], [name]             ACCOUNT NO.:  [id number]  MEDICAL RECORD NO.:  [id number]  LOCATION:  9305                          FACILITY:  [facility]  PHYSICIAN:  Lovenia Kim, M.D.DATE OF BIRTH:  27-Aug-1950  DATE OF PROCEDURE:  04/01/2013 DATE OF DISCHARGE:                              OPERATIVE REPORT   PREOPERATIVE DIAGNOSES:  Symptomatic pelvic relaxation with cystocele, rectocele, and enterocele.  POSTOPERATIVE DIAGNOSES:  Symptomatic pelvic relaxation with cystocele, rectocele, and enterocele plus pelvic adhesions.  PROCEDURE:  Total vaginal hysterectomy, lysis of adhesions, anterior colporrhaphy, posterior colporrhaphy, perineorrhaphy, enterocele repair, McCall culdoplasty.  SURGEON:  Lovenia Kim, MD  ASSISTANT:  Renato Battles, CNM.  ESTIMATED BLOOD LOSS:  100 mL.  COMPLICATIONS:  None.  DRAINS:  Foley.  COUNTS:  Correct.  The patient to recovery in good condition.  BRIEF OPERATIVE NOTE:  After being apprised of the risks of anesthesia, infection, bleeding, injury to surrounding organs, possible need for repair, delayed versus immediate complications to include bowel and bladder injury, possible need for repair, patient was brought to the operating room, where she was administered general anesthetic without complications, prepped and draped in usual sterile fashion.  Foley catheter placed.  Exam under anesthesia reveals a grade 2 uterine descensus and a grade 2 cystocele, grade 1 rectocele, perineal relaxation.  At this time, speculum was placed.  Cervicovaginal junction was infiltrated with a dilute Pitressin solution and circumscribed using a knife.  The anterior vesicouterine fold was developed and the posterior cul-de-sac entry was made.  Uterosacral ligaments are bilaterally clamped using Heaney clamp, and transfixed to the vaginal cuff.  Progressive bites are taken along the lateral cardinal pedicles and then the uterine vessels, which were all  clamped and cut and transfixed to the previous suture with a 0 Vicryl pop-off suture.  At this time, anterior cul-de-sac entry was made without difficulty, retractors placed and progressive bites at the level of the round ligament were noted.  The uterus was found to be quite globular and enlarged.  The cervix was amputated and the specimen is morcellated by coring out circumferential tissue surrounding the endometrium and then wedge-shaped removal of tissue from the posterior aspect of the uterus, at this point, was unable to be delivered.  They are bilaterally clamped using Haney clamps.  The specimen was then removed.  The adnexal pedicles are inspected, transfixed, sutured, and tied.  At this time, good hemostasis was noted.  The enterocele was identified and closed using 2 external McCall culdoplasty sutures of 0 Vicryl.  Good hemostasis was achieved.  These sutures were then held.  The vaginal cuff was then closed in a vertical fashion using interrupted sutures. Uterosacral ligaments were plicated in the midline and the McCall culdoplasty sutures were tied down as well.  Cystocele repair was then done developing over the pubovesical cervical fascia in the usual fashion.  The cystocele was imbricated, reduced using a 0 Vicryl interrupted mattress suture x3.  The cuff was then trimmed.  The excessive vaginal mucosal cuff was then trimmed and the suture defect was closed using 0 Vicryl sutures in an interrupted fashion.  Urine was clear.  At this time, posterior repair and perineorrhaphy were performed  in a standard fashion, as a wedge shaped tissue is removed from the perineum.  The rectocele was identified using a rectal exam, Allis clamp placed in the vagina, dilute Pitressin solution infiltrated.  The rectovaginal mucosa was opened and undermined.  The defect was reduced, imbricated using a 0 Vicryl suture and the defect was closed using 0 Vicryl interrupted sutures as well.  The  perineorrhaphy was performed using a 2-0 Vicryl in a standard fashion.  Good hemostasis was noted. All instruments were removed.  The patient tolerated the procedure well, was awakened, and transferred to recovery in good condition after vaginal packing was placed.     Lovenia Kim, M.D.     RJT/MEDQ  D:  04/01/2013  T:  04/02/2013  Job:  [id number]

## 2013-04-02 NOTE — Progress Notes (Signed)
1 Day Post-Op Procedure(s) (LRB): ANTERIOR REPAIR (CYSTOCELE) with Enterocele Repair (N/A) POSTERIOR REPAIR (RECTOCELE) (N/A) HYSTERECTOMY TOTAL VAGINAL (N/A)  Subjective: Patient reports nausea, tolerating PO, + flatus and no problems voiding.    Objective: I have reviewed patient's vital signs, medications and labs. Patient Vitals for the past 24 hrs:  BP Temp Temp src Pulse Resp SpO2 Height Weight  04/02/13 0952 102/64 mmHg 98 F (36.7 C) Oral 76 18 98 % - -  04/02/13 0632 96/54 mmHg 98.4 F (36.9 C) Oral 67 16 96 % - -  04/02/13 0514 - - - - 15 96 % - -  04/02/13 0209 120/63 mmHg 98.7 F (37.1 C) Oral 81 16 96 % - -  04/02/13 0105 - - - - 16 96 % - -  04/01/13 2233 122/65 mmHg 98.1 F (36.7 C) Oral 71 16 94 % - -  04/01/13 2120 - - - - 15 96 % - -  04/01/13 2103 113/67 mmHg 98 F (36.7 C) Oral 88 15 96 % - -  04/01/13 2010 136/72 mmHg 98.6 F (37 C) Oral 84 16 96 % - -  04/01/13 1845 134/67 mmHg 98 F (36.7 C) Oral 83 18 97 % - -  04/01/13 1810 - - - - 14 96 % 5\' 2"  (1.575 m) 75.297 kg (166 lb)  04/01/13 1800 128/67 mmHg 98.1 F (36.7 C) - 87 12 95 % - -  04/01/13 1700 116/64 mmHg - - 71 19 99 % - -  04/01/13 1645 126/71 mmHg 98.5 F (36.9 C) - 75 15 100 % - -  04/01/13 1630 119/59 mmHg - - 71 23 100 % - -  04/01/13 1615 104/88 mmHg - - 85 24 100 % - -  04/01/13 1600 137/68 mmHg - - 75 14 98 % - -  04/01/13 1545 118/56 mmHg 98.3 F (36.8 C) - 75 16 99 % - -  04/01/13 1137 144/96 mmHg 97.9 F (36.6 C) Oral 85 18 100 % - -   CBC    Component Value Date/Time   WBC 12.3* 04/02/2013 0540   RBC 3.64* 04/02/2013 0540   HGB 9.7* 04/02/2013 0540   HCT 29.1* 04/02/2013 0540   PLT 166 04/02/2013 0540   MCV 79.9 04/02/2013 0540   MCH 26.6 04/02/2013 0540   MCHC 33.3 04/02/2013 0540   RDW 14.3 04/02/2013 0540   LYMPHSABS 2.4 05/03/2010 1530   MONOABS 0.4 05/03/2010 1530   EOSABS 0.1 05/03/2010 1530   BASOSABS 0.0 05/03/2010 1530      General: alert, cooperative and appears  stated age Resp: clear to auscultation bilaterally and normal percussion bilaterally Cardio: regular rate and rhythm, S1, S2 normal, no murmur, click, rub or gallop and normal apical impulse GI: soft, non-tender; bowel sounds normal; no masses,  no organomegaly, normal findings: aorta normal and bowel sounds normal and incision: na Extremities: extremities normal, atraumatic, no cyanosis or edema and Homans sign is negative, no sign of DVT Vaginal Bleeding: minimal  Assessment: s/p Procedure(s) with comments: ANTERIOR REPAIR (CYSTOCELE) with Enterocele Repair (N/A) - 90 min. POSTERIOR REPAIR (RECTOCELE) (N/A) HYSTERECTOMY TOTAL VAGINAL (N/A): stable, progressing well and tolerating diet  Plan: Advance diet Encourage ambulation Advance to PO medication Discontinue IV fluids Discharge home  LOS: 1 day    Russel Morain J 04/02/2013, 11:25 AM

## 2013-04-02 NOTE — Progress Notes (Signed)
Ambulated out  Teaching complete   

## 2013-04-02 NOTE — Addendum Note (Signed)
Addendum created 04/02/13 4944 by Billie Lade, CRNA   Modules edited: Notes Section   Notes Section:  File: [id number]

## 2013-04-02 NOTE — Anesthesia Postprocedure Evaluation (Signed)
  Anesthesia Post-op Note Anesthesia Post Note  Patient: [name]  Procedure(s) Performed: Procedure(s) (LRB): ANTERIOR REPAIR (CYSTOCELE) with Enterocele Repair (N/A) POSTERIOR REPAIR (RECTOCELE) (N/A) HYSTERECTOMY TOTAL VAGINAL (N/A)  Anesthesia type: General  Patient location: [facility]  Post pain: Pain level controlled  Post assessment: Post-op Vital signs reviewed  Last Vitals:  Filed Vitals:   04/02/13 0632  BP: 96/54  Pulse: 67  Temp: 36.9 C  Resp: 16    Post vital signs: Reviewed  Level of consciousness: sedated  Complications: No apparent anesthesia complications

## 2013-10-31 ENCOUNTER — Emergency Department (HOSPITAL_COMMUNITY)
Admission: EM | Admit: 2013-10-31 | Discharge: 2013-10-31 | Disposition: A | Discharge status: Home / Self Care | Payer: BC Managed Care – PPO | Attending: Emergency Medicine | Admitting: Emergency Medicine

## 2013-10-31 ENCOUNTER — Encounter (HOSPITAL_COMMUNITY): Payer: Self-pay | Admitting: Emergency Medicine

## 2013-10-31 ENCOUNTER — Emergency Department (HOSPITAL_COMMUNITY): Payer: BC Managed Care – PPO

## 2013-10-31 DIAGNOSIS — M25562 Pain in left knee: Secondary | ICD-10-CM

## 2013-10-31 DIAGNOSIS — S59919A Unspecified injury of unspecified forearm, initial encounter: Principal | ICD-10-CM

## 2013-10-31 DIAGNOSIS — S59909A Unspecified injury of unspecified elbow, initial encounter: Secondary | ICD-10-CM | POA: Insufficient documentation

## 2013-10-31 DIAGNOSIS — I10 Essential (primary) hypertension: Secondary | ICD-10-CM | POA: Insufficient documentation

## 2013-10-31 DIAGNOSIS — S99929A Unspecified injury of unspecified foot, initial encounter: Secondary | ICD-10-CM

## 2013-10-31 DIAGNOSIS — M79601 Pain in right arm: Secondary | ICD-10-CM

## 2013-10-31 DIAGNOSIS — Y92410 Unspecified street and highway as the place of occurrence of the external cause: Secondary | ICD-10-CM | POA: Diagnosis not present

## 2013-10-31 DIAGNOSIS — Z8639 Personal history of other endocrine, nutritional and metabolic disease: Secondary | ICD-10-CM | POA: Insufficient documentation

## 2013-10-31 DIAGNOSIS — Z862 Personal history of diseases of the blood and blood-forming organs and certain disorders involving the immune mechanism: Secondary | ICD-10-CM | POA: Insufficient documentation

## 2013-10-31 DIAGNOSIS — Z8739 Personal history of other diseases of the musculoskeletal system and connective tissue: Secondary | ICD-10-CM | POA: Diagnosis not present

## 2013-10-31 DIAGNOSIS — S99919A Unspecified injury of unspecified ankle, initial encounter: Secondary | ICD-10-CM | POA: Diagnosis not present

## 2013-10-31 DIAGNOSIS — Y9389 Activity, other specified: Secondary | ICD-10-CM | POA: Insufficient documentation

## 2013-10-31 DIAGNOSIS — Z79899 Other long term (current) drug therapy: Secondary | ICD-10-CM | POA: Insufficient documentation

## 2013-10-31 DIAGNOSIS — S6990XA Unspecified injury of unspecified wrist, hand and finger(s), initial encounter: Principal | ICD-10-CM

## 2013-10-31 DIAGNOSIS — S8990XA Unspecified injury of unspecified lower leg, initial encounter: Secondary | ICD-10-CM | POA: Insufficient documentation

## 2013-10-31 MED ORDER — TRAMADOL HCL 50 MG PO TABS: 50.0000 mg | ORAL_TABLET | Freq: Four times a day (QID) | ORAL | Status: DC | PRN

## 2013-10-31 NOTE — Discharge Instructions (Signed)
Take the prescribed medication as directed.  You may continue to be sore for the next several days-- this is normal. Follow-up with your primary care physician as needed. Return to the ED for new or worsening symptoms.
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
## 2013-11-13 NOTE — ED Provider Notes (Signed)
Medical screening examination/treatment/procedure(s) were performed by non-physician practitioner and as supervising physician I was immediately available for consultation/collaboration.   EKG Interpretation None       Babette Relic, MD 11/13/13 1328

## 2015-09-03 DIAGNOSIS — Z1231 Encounter for screening mammogram for malignant neoplasm of breast: Secondary | ICD-10-CM | POA: Diagnosis not present

## 2015-09-03 DIAGNOSIS — R7309 Other abnormal glucose: Secondary | ICD-10-CM | POA: Diagnosis not present

## 2015-09-03 DIAGNOSIS — Z0000 Encounter for general adult medical examination without abnormal findings: Secondary | ICD-10-CM | POA: Diagnosis not present

## 2015-09-03 DIAGNOSIS — I10 Essential (primary) hypertension: Secondary | ICD-10-CM | POA: Diagnosis not present

## 2015-09-03 DIAGNOSIS — M150 Primary generalized (osteo)arthritis: Secondary | ICD-10-CM | POA: Diagnosis not present

## 2015-09-03 DIAGNOSIS — E782 Mixed hyperlipidemia: Secondary | ICD-10-CM | POA: Diagnosis not present

## 2015-09-21 ENCOUNTER — Other Ambulatory Visit: Payer: Self-pay | Admitting: Nurse Practitioner

## 2015-09-21 ENCOUNTER — Other Ambulatory Visit: Payer: Self-pay | Admitting: Obstetrics and Gynecology

## 2015-09-21 DIAGNOSIS — Z1231 Encounter for screening mammogram for malignant neoplasm of breast: Secondary | ICD-10-CM

## 2015-10-08 ENCOUNTER — Ambulatory Visit
Admission: RE | Admit: 2015-10-08 | Discharge: 2015-10-08 | Disposition: A | Discharge status: Home / Self Care | Payer: BLUE CROSS/BLUE SHIELD | Source: Ambulatory Visit | Attending: Diagnostic Radiology | Admitting: Diagnostic Radiology

## 2015-10-08 DIAGNOSIS — Z1231 Encounter for screening mammogram for malignant neoplasm of breast: Secondary | ICD-10-CM | POA: Diagnosis not present

## 2015-10-13 ENCOUNTER — Other Ambulatory Visit: Payer: Self-pay | Admitting: Diagnostic Radiology

## 2015-10-13 DIAGNOSIS — R928 Other abnormal and inconclusive findings on diagnostic imaging of breast: Secondary | ICD-10-CM

## 2015-10-14 ENCOUNTER — Other Ambulatory Visit: Payer: Self-pay | Admitting: Nurse Practitioner

## 2015-10-14 DIAGNOSIS — R928 Other abnormal and inconclusive findings on diagnostic imaging of breast: Secondary | ICD-10-CM

## 2015-10-20 ENCOUNTER — Inpatient Hospital Stay: Admission: RE | Admit: 2015-10-20 | Payer: BLUE CROSS/BLUE SHIELD | Source: Ambulatory Visit

## 2015-10-20 ENCOUNTER — Other Ambulatory Visit: Payer: BLUE CROSS/BLUE SHIELD

## 2015-11-11 DIAGNOSIS — N939 Abnormal uterine and vaginal bleeding, unspecified: Secondary | ICD-10-CM | POA: Diagnosis not present

## 2015-11-11 DIAGNOSIS — Z87891 Personal history of nicotine dependence: Secondary | ICD-10-CM | POA: Diagnosis not present

## 2015-11-12 ENCOUNTER — Other Ambulatory Visit: Payer: Self-pay | Admitting: Nurse Practitioner

## 2015-11-12 ENCOUNTER — Ambulatory Visit
Admission: RE | Admit: 2015-11-12 | Discharge: 2015-11-12 | Disposition: A | Discharge status: Home / Self Care | Payer: BLUE CROSS/BLUE SHIELD | Source: Ambulatory Visit | Attending: Nurse Practitioner | Admitting: Nurse Practitioner

## 2015-11-12 DIAGNOSIS — R928 Other abnormal and inconclusive findings on diagnostic imaging of breast: Secondary | ICD-10-CM

## 2015-11-12 DIAGNOSIS — R921 Mammographic calcification found on diagnostic imaging of breast: Secondary | ICD-10-CM | POA: Diagnosis not present

## 2015-11-12 DIAGNOSIS — N63 Unspecified lump in breast: Secondary | ICD-10-CM | POA: Diagnosis not present

## 2015-11-19 DIAGNOSIS — R58 Hemorrhage, not elsewhere classified: Secondary | ICD-10-CM | POA: Diagnosis not present

## 2015-11-19 DIAGNOSIS — R300 Dysuria: Secondary | ICD-10-CM | POA: Diagnosis not present

## 2015-11-19 DIAGNOSIS — N950 Postmenopausal bleeding: Secondary | ICD-10-CM | POA: Diagnosis not present

## 2015-11-24 ENCOUNTER — Other Ambulatory Visit: Payer: Self-pay | Admitting: Nurse Practitioner

## 2015-11-24 DIAGNOSIS — R921 Mammographic calcification found on diagnostic imaging of breast: Secondary | ICD-10-CM

## 2015-11-26 ENCOUNTER — Other Ambulatory Visit: Payer: Self-pay | Admitting: Nurse Practitioner

## 2015-11-26 ENCOUNTER — Inpatient Hospital Stay: Admission: RE | Admit: 2015-11-26 | Payer: BLUE CROSS/BLUE SHIELD | Source: Ambulatory Visit

## 2015-11-26 DIAGNOSIS — R921 Mammographic calcification found on diagnostic imaging of breast: Secondary | ICD-10-CM

## 2015-12-24 DIAGNOSIS — R319 Hematuria, unspecified: Secondary | ICD-10-CM | POA: Diagnosis not present

## 2016-02-17 ENCOUNTER — Encounter (HOSPITAL_COMMUNITY): Payer: Self-pay | Admitting: Emergency Medicine

## 2016-02-17 ENCOUNTER — Emergency Department (HOSPITAL_COMMUNITY)
Admission: EM | Admit: 2016-02-17 | Discharge: 2016-02-17 | Disposition: A | Discharge status: Home / Self Care | Payer: No Typology Code available for payment source | Attending: Emergency Medicine | Admitting: Emergency Medicine

## 2016-02-17 DIAGNOSIS — Y92410 Unspecified street and highway as the place of occurrence of the external cause: Secondary | ICD-10-CM | POA: Diagnosis not present

## 2016-02-17 DIAGNOSIS — Y939 Activity, unspecified: Secondary | ICD-10-CM | POA: Insufficient documentation

## 2016-02-17 DIAGNOSIS — I10 Essential (primary) hypertension: Secondary | ICD-10-CM | POA: Insufficient documentation

## 2016-02-17 DIAGNOSIS — Z041 Encounter for examination and observation following transport accident: Secondary | ICD-10-CM | POA: Insufficient documentation

## 2016-02-17 DIAGNOSIS — Z79899 Other long term (current) drug therapy: Secondary | ICD-10-CM | POA: Insufficient documentation

## 2016-02-17 DIAGNOSIS — Y999 Unspecified external cause status: Secondary | ICD-10-CM | POA: Insufficient documentation

## 2016-02-17 NOTE — ED Notes (Signed)
PT DISCHARGED. INSTRUCTIONS GIVEN. AAOX4. PT IN NO APPARENT DISTRESS OR PAIN. THE OPPORTUNITY TO ASK QUESTIONS WAS PROVIDED. 
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
## 2016-03-10 DIAGNOSIS — I10 Essential (primary) hypertension: Secondary | ICD-10-CM | POA: Diagnosis not present

## 2016-03-10 DIAGNOSIS — E782 Mixed hyperlipidemia: Secondary | ICD-10-CM | POA: Diagnosis not present

## 2016-03-10 DIAGNOSIS — M549 Dorsalgia, unspecified: Secondary | ICD-10-CM | POA: Diagnosis not present

## 2016-04-07 DIAGNOSIS — R310 Gross hematuria: Secondary | ICD-10-CM | POA: Diagnosis not present

## 2016-04-07 DIAGNOSIS — N952 Postmenopausal atrophic vaginitis: Secondary | ICD-10-CM | POA: Diagnosis not present

## 2016-06-09 DIAGNOSIS — I10 Essential (primary) hypertension: Secondary | ICD-10-CM | POA: Diagnosis not present

## 2016-06-09 DIAGNOSIS — E782 Mixed hyperlipidemia: Secondary | ICD-10-CM | POA: Diagnosis not present

## 2016-09-08 DIAGNOSIS — E782 Mixed hyperlipidemia: Secondary | ICD-10-CM | POA: Diagnosis not present

## 2016-09-08 DIAGNOSIS — R7309 Other abnormal glucose: Secondary | ICD-10-CM | POA: Diagnosis not present

## 2016-09-08 DIAGNOSIS — Z0000 Encounter for general adult medical examination without abnormal findings: Secondary | ICD-10-CM | POA: Diagnosis not present

## 2016-09-08 DIAGNOSIS — I10 Essential (primary) hypertension: Secondary | ICD-10-CM | POA: Diagnosis not present

## 2017-03-16 DIAGNOSIS — M25562 Pain in left knee: Secondary | ICD-10-CM | POA: Diagnosis not present

## 2017-03-16 DIAGNOSIS — E782 Mixed hyperlipidemia: Secondary | ICD-10-CM | POA: Diagnosis not present

## 2017-03-16 DIAGNOSIS — I10 Essential (primary) hypertension: Secondary | ICD-10-CM | POA: Diagnosis not present

## 2017-03-16 DIAGNOSIS — J00 Acute nasopharyngitis [common cold]: Secondary | ICD-10-CM | POA: Diagnosis not present

## 2017-09-28 DIAGNOSIS — E782 Mixed hyperlipidemia: Secondary | ICD-10-CM | POA: Diagnosis not present

## 2017-09-28 DIAGNOSIS — Z0000 Encounter for general adult medical examination without abnormal findings: Secondary | ICD-10-CM | POA: Diagnosis not present

## 2017-09-28 DIAGNOSIS — Z23 Encounter for immunization: Secondary | ICD-10-CM | POA: Diagnosis not present

## 2017-09-28 DIAGNOSIS — I10 Essential (primary) hypertension: Secondary | ICD-10-CM | POA: Diagnosis not present

## 2017-09-28 DIAGNOSIS — Z1231 Encounter for screening mammogram for malignant neoplasm of breast: Secondary | ICD-10-CM | POA: Diagnosis not present

## 2017-10-06 ENCOUNTER — Other Ambulatory Visit: Payer: Self-pay | Admitting: Nurse Practitioner

## 2017-10-06 DIAGNOSIS — E2839 Other primary ovarian failure: Secondary | ICD-10-CM

## 2018-01-21 ENCOUNTER — Other Ambulatory Visit: Payer: Self-pay | Admitting: Nurse Practitioner

## 2018-03-29 ENCOUNTER — Encounter: Payer: Self-pay | Admitting: Nurse Practitioner

## 2018-03-29 ENCOUNTER — Ambulatory Visit: Payer: BLUE CROSS/BLUE SHIELD | Admitting: Nurse Practitioner

## 2018-03-29 VITALS — BP 116/80 | HR 70 | Temp 98.7°F | Ht <= 58 in | Wt 154.0 lb

## 2018-03-29 DIAGNOSIS — E782 Mixed hyperlipidemia: Secondary | ICD-10-CM | POA: Diagnosis not present

## 2018-03-29 DIAGNOSIS — I10 Essential (primary) hypertension: Secondary | ICD-10-CM | POA: Diagnosis not present

## 2018-03-29 DIAGNOSIS — R7303 Prediabetes: Secondary | ICD-10-CM | POA: Diagnosis not present

## 2018-03-29 NOTE — Progress Notes (Signed)
Subjective:     Patient ID: [name] , female    DOB: [birth year] , 68 y.o.   MRN: [id number]   Chief Complaint  Patient presents with  . Hypertension    HPI  Hypertension  This is a chronic problem. The current episode started more than 1 year ago. The problem is unchanged. The problem is controlled. Pertinent negatives include no anxiety, chest pain, headaches, palpitations or shortness of breath. There are no associated agents to hypertension. Risk factors for coronary artery disease include sedentary lifestyle. Past treatments include diuretics. There are no compliance problems.  There is no history of angina or kidney disease. There is no history of chronic renal disease.     Past Medical History:  Diagnosis Date  . Arthritis    shoulder - no meds  . Hyperlipidemia   . Hypertension   . SVD (spontaneous vaginal delivery)    x 5     Family History  Problem Relation Age of Onset  . Diabetes Mother      Current Outpatient Medications:  .  hydrochlorothiazide (HYDRODIURIL) 25 MG tablet, TAKE 1 TABLET BY MOUTH ONCE DAILY, Disp: 90 tablet, Rfl: 1 .  traMADol (ULTRAM) 50 MG tablet, Take 1 tablet (50 mg total) by mouth every 6 (six) hours as needed., Disp: 20 tablet, Rfl: 0   Allergies  Allergen Reactions  . Pollen Extract     Watery Eyes & Runny Nose     Review of Systems  Constitutional: Negative.  Negative for fatigue.  Eyes: Negative for visual disturbance.  Respiratory: Negative.  Negative for shortness of breath.   Cardiovascular: Negative.  Negative for chest pain, palpitations and leg swelling.  Gastrointestinal: Negative.   Endocrine: Negative.   Musculoskeletal: Negative.   Skin: Negative.   Neurological: Negative for dizziness, weakness and headaches.  Psychiatric/Behavioral: Negative for confusion. The patient is not nervous/anxious.      Today's Vitals   03/29/18 1510  BP: 116/80  Pulse: 70  Temp: 98.7 F (37.1 C)  TempSrc: Oral  SpO2: 98%   Weight: 154 lb (69.9 kg)  Height: 4' 6.2" (1.377 m)  PainSc: 0-No pain   Body mass index is 36.86 kg/m.   Objective:  Physical Exam Vitals signs reviewed.  Constitutional:      Appearance: She is well-developed.  HENT:     Head: Normocephalic and atraumatic.  Eyes:     Pupils: Pupils are equal, round, and reactive to light.  Cardiovascular:     Rate and Rhythm: Normal rate and regular rhythm.     Pulses: Normal pulses.     Heart sounds: Normal heart sounds. No murmur.  Pulmonary:     Effort: Pulmonary effort is normal.     Breath sounds: Normal breath sounds.  Musculoskeletal: Normal range of motion.  Skin:    General: Skin is warm and dry.     Capillary Refill: Capillary refill takes less than 2 seconds.  Neurological:     General: No focal deficit present.     Mental Status: She is alert and oriented to person, place, and time.     Cranial Nerves: No cranial nerve deficit.  Psychiatric:        Mood and Affect: Mood normal.         Assessment And Plan:     1. Essential hypertension . B/P is controlled.  Marland Kitchen BMP ordered to check renal function.  . The importance of regular exercise and dietary modification was stressed to the  patient.  - BMP8+eGFR  2. Mixed hyperlipidemia  Chronic, controlled  Continue with current medications - BMP8+eGFR - Lipid Profile  3. Prediabetes  Chronic, stable  No current medications  Encouraged to limit intake of sugary foods and drinks  Encouraged to increase physical activity to 150 minutes per week - BMP8+eGFR - Hemoglobin A1c   [name], [name]

## 2018-03-30 LAB — BMP8+EGFR
BUN / CREAT RATIO: 19 (ref 12–28)
BUN: 13 mg/dL (ref 8–27)
CHLORIDE: 101 mmol/L (ref 96–106)
CO2: 25 mmol/L (ref 20–29)
Calcium: 9.3 mg/dL (ref 8.7–10.3)
Creatinine, Ser: 0.68 mg/dL (ref 0.57–1.00)
GFR, EST AFRICAN AMERICAN: 105 mL/min/{1.73_m2} (ref >59)
GFR, EST NON AFRICAN AMERICAN: 91 mL/min/{1.73_m2} (ref >59)
Glucose: 86 mg/dL (ref 65–99)
POTASSIUM: 3.7 mmol/L (ref 3.5–5.2)
Sodium: 142 mmol/L (ref 134–144)

## 2018-03-30 LAB — LIPID PANEL
CHOL/HDL RATIO: 6.4 ratio — AB (ref 0.0–4.4)
Cholesterol, Total: 212 mg/dL — ABNORMAL HIGH (ref 100–199)
HDL: 33 mg/dL — AB (ref >39)
LDL Calculated: 157 mg/dL — ABNORMAL HIGH (ref 0–99)
Triglycerides: 110 mg/dL (ref 0–149)
VLDL Cholesterol Cal: 22 mg/dL (ref 5–40)

## 2018-03-30 LAB — HEMOGLOBIN A1C
ESTIMATED AVERAGE GLUCOSE: 114 mg/dL
Hgb A1c MFr Bld: 5.6 % (ref 4.8–5.6)

## 2018-05-15 IMAGING — MG 2D DIGITAL SCREENING BILATERAL MAMMOGRAM WITH CAD AND ADJUNCT TO
2 series · 3 of 6 positions shown · non-contrast
Comparison: None.

CLINICAL DATA: Screening.

EXAM:
2D DIGITAL SCREENING BILATERAL MAMMOGRAM WITH CAD AND ADJUNCT TOMO

[R MLO]
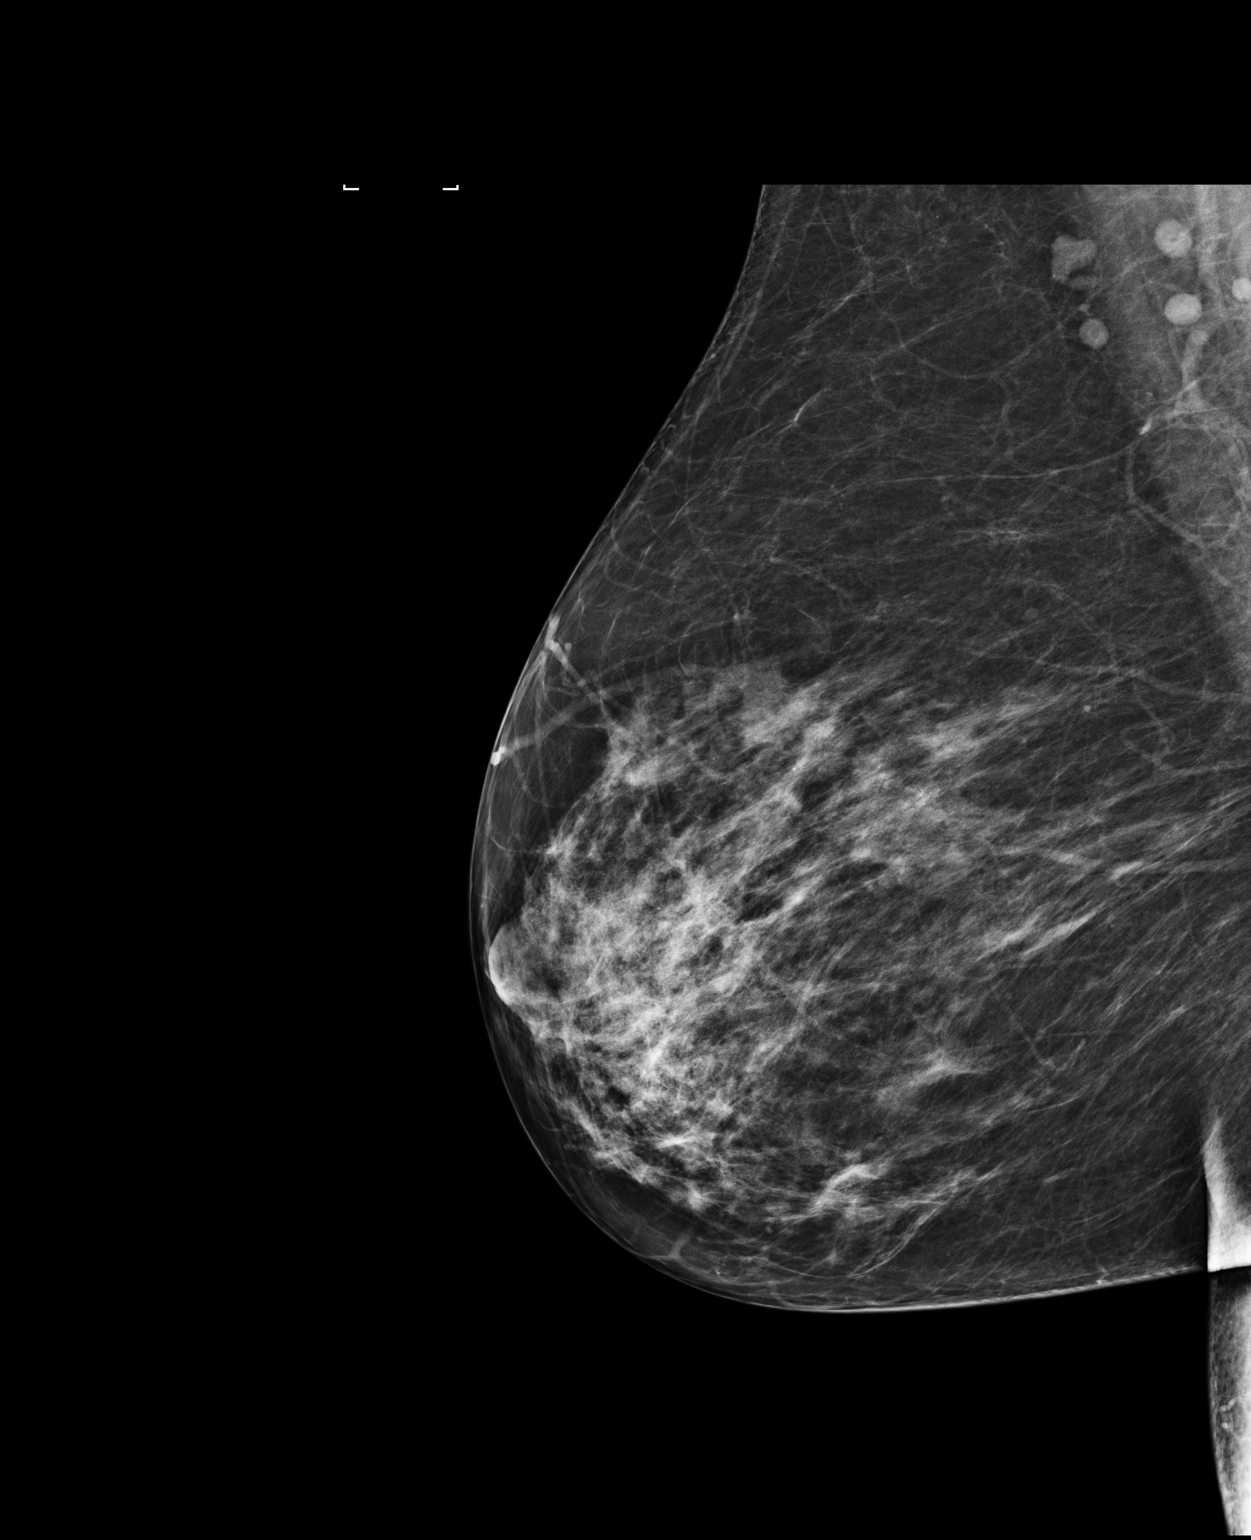

[R MLO tomo · 2 of 67 frames shown]
[frame 22/67]
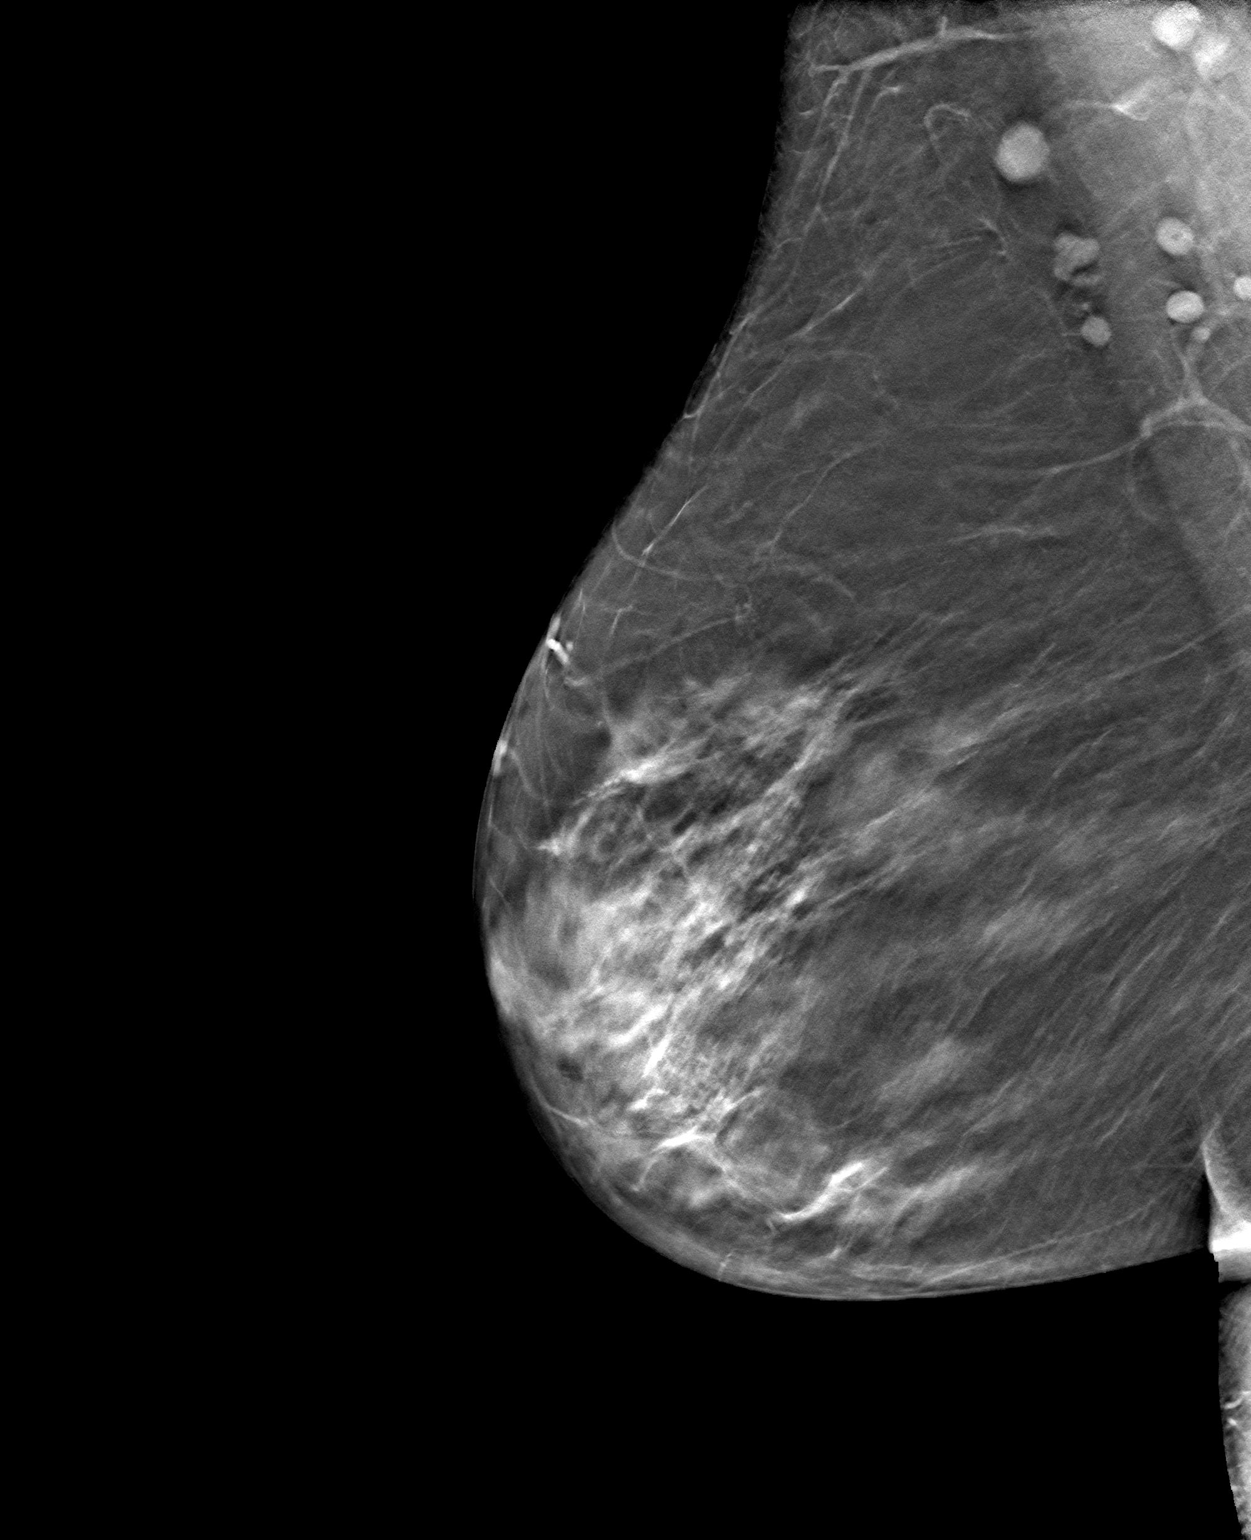
[frame 34/67]
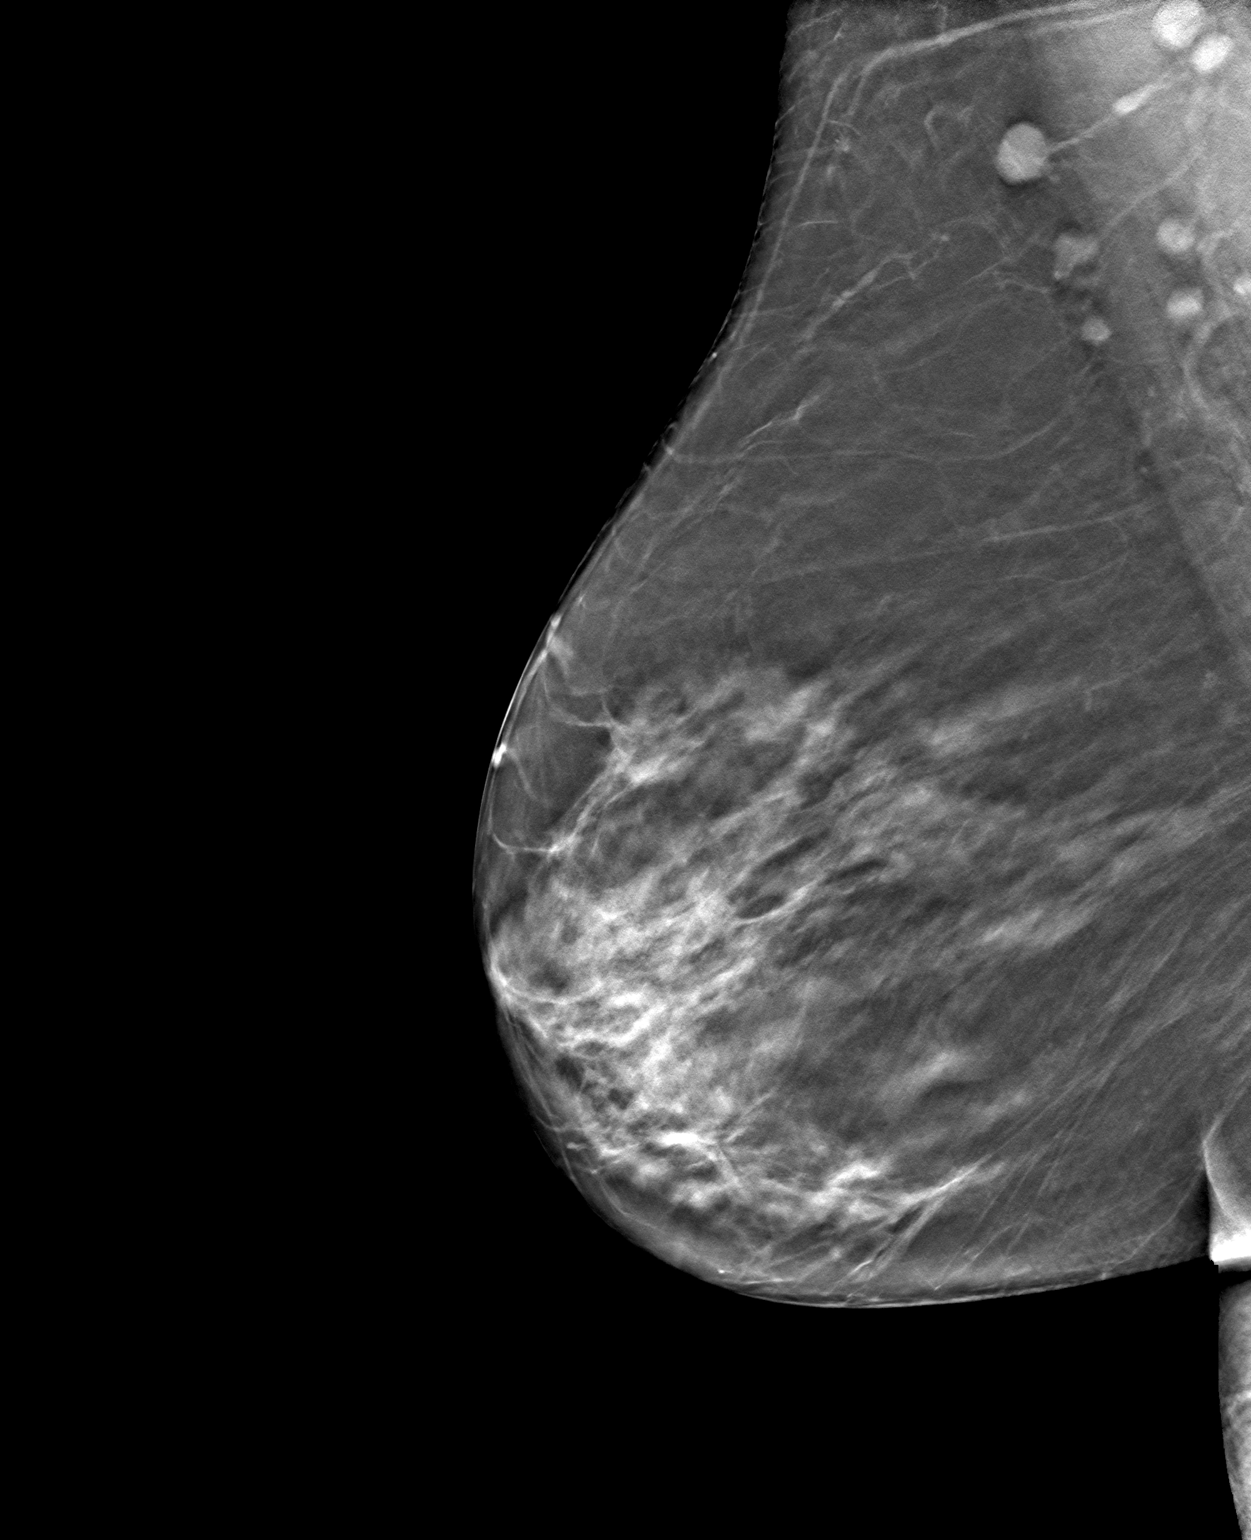

[3 of 6 positions shown; findings below may reference images not displayed]

ACR Breast Density Category c: The breast tissue is heterogeneously
dense, which may obscure small masses.
FINDINGS: In the right breast, a possible mass and calcifications warrant
further evaluation. In the left breast, no findings suspicious for
malignancy. Motion is detected on the left MLO view, and technical
repeat is recommended. Images were processed with CAD.
IMPRESSION: Further evaluation is suggested for possible mass and calcifications
in the right breast.

RECOMMENDATION:
1. Diagnostic mammogram and possibly ultrasound of the right breast.
(Code:3S-H-SSN)

2. Technical repeat of the left MLO for motion is recommended at the
time of the diagnostic evaluation.

The patient will be contacted regarding the findings, and additional
imaging will be scheduled.

BI-RADS CATEGORY  0: Incomplete. Need additional imaging evaluation
and/or prior mammograms for comparison.

## 2018-08-31 ENCOUNTER — Other Ambulatory Visit: Payer: Self-pay | Admitting: Nurse Practitioner

## 2018-09-04 ENCOUNTER — Other Ambulatory Visit: Payer: Self-pay | Admitting: Nurse Practitioner

## 2018-10-04 ENCOUNTER — Encounter: Payer: Self-pay | Admitting: Nurse Practitioner

## 2018-10-24 ENCOUNTER — Encounter: Payer: Self-pay | Admitting: Nurse Practitioner

## 2018-10-25 ENCOUNTER — Encounter: Payer: Self-pay | Admitting: Nurse Practitioner

## 2018-10-25 ENCOUNTER — Ambulatory Visit: Payer: BC Managed Care – PPO | Admitting: Nurse Practitioner

## 2018-10-25 ENCOUNTER — Other Ambulatory Visit: Payer: Self-pay

## 2018-10-25 VITALS — BP 120/70 | HR 84 | Temp 98.4°F | Ht 61.6 in | Wt 155.4 lb

## 2018-10-25 DIAGNOSIS — I10 Essential (primary) hypertension: Secondary | ICD-10-CM

## 2018-10-25 DIAGNOSIS — E2839 Other primary ovarian failure: Secondary | ICD-10-CM

## 2018-10-25 DIAGNOSIS — Z23 Encounter for immunization: Secondary | ICD-10-CM | POA: Diagnosis not present

## 2018-10-25 DIAGNOSIS — Z1239 Encounter for other screening for malignant neoplasm of breast: Secondary | ICD-10-CM | POA: Diagnosis not present

## 2018-10-25 DIAGNOSIS — E782 Mixed hyperlipidemia: Secondary | ICD-10-CM

## 2018-10-25 DIAGNOSIS — R7303 Prediabetes: Secondary | ICD-10-CM

## 2018-10-25 DIAGNOSIS — Z1159 Encounter for screening for other viral diseases: Secondary | ICD-10-CM | POA: Diagnosis not present

## 2018-10-25 DIAGNOSIS — Z0000 Encounter for general adult medical examination without abnormal findings: Secondary | ICD-10-CM | POA: Diagnosis not present

## 2018-10-25 LAB — POCT URINALYSIS DIPSTICK
Bilirubin, UA: NEGATIVE
Blood, UA: NEGATIVE
Glucose, UA: NEGATIVE
Ketones, UA: NEGATIVE
Leukocytes, UA: NEGATIVE
Nitrite, UA: NEGATIVE
Protein, UA: NEGATIVE
Spec Grav, UA: 1.02 (ref 1.010–1.025)
Urobilinogen, UA: 1 E.U./dL
pH, UA: 8 (ref 5.0–8.0)

## 2018-10-25 LAB — POCT UA - MICROALBUMIN
Albumin/Creatinine Ratio, Urine, POC: 30
Creatinine, POC: 50 mg/dL
Microalbumin Ur, POC: 10 mg/L

## 2018-10-25 MED ORDER — PNEUMOCOCCAL 13-VAL CONJ VACC IM SUSP: 0.5000 mL | INTRAMUSCULAR | 0 refills | Status: AC

## 2018-10-25 MED ORDER — TETANUS-DIPHTH-ACELL PERTUSSIS 5-2.5-18.5 LF-MCG/0.5 IM SUSP
0.5000 mL | Freq: Once | INTRAMUSCULAR | Status: AC
  Administered 2018-10-25: 0.5 mL via INTRAMUSCULAR

## 2018-10-25 NOTE — Progress Notes (Addendum)
Subjective:     Patient ID: [name] , female    DOB: 19-Aug-1950 , 68 y.o.   MRN: [id number]   Chief Complaint  Patient presents with  . Annual Exam    HPI The patient states she uses status post hysterectomy for birth control. Last LMP was No LMP recorded. Patient has had a hysterectomy.  Mammogram last done 2017.  Negative for: breast discharge, breast lump(s), breast pain and breast self exam.  Pertinent negatives include  anxiety, decreased libido, depression, difficulty falling sleep, dyspareunia, history of infertility, nocturia, sexual dysfunction, sleep disturbances, urinary incontinence, urinary urgency, vaginal discharge and vaginal itching. Diet regular.The patient states her exercise level is      The patient's tobacco use is:  Social History   Tobacco Use  Smoking Status Never Smoker  Smokeless Tobacco Never Used  . She has been exposed to passive smoke. The patient's alcohol use is:  Social History   Substance and Sexual Activity  Alcohol Use Yes   Comment: socially     Here for HM  Hypertension This is a chronic problem. The current episode started more than 1 year ago. The problem is unchanged. The problem is controlled. Pertinent negatives include no anxiety. There are no associated agents to hypertension. Risk factors for coronary artery disease include sedentary lifestyle. Past treatments include diuretics. The current treatment provides significant improvement. Compliance problems include exercise.  There is no history of angina.     Past Medical History:  Diagnosis Date  . Arthritis    shoulder - no meds  . Hyperlipidemia   . Hypertension   . SVD (spontaneous vaginal delivery)    x 5     Family History  Problem Relation Age of Onset  . Diabetes Mother      Current Outpatient Medications:  .  hydrochlorothiazide (HYDRODIURIL) 25 MG tablet, Take 1 tablet by mouth once daily, Disp: 90 tablet, Rfl: 0   Allergies  Allergen Reactions  .  Pollen Extract     Watery Eyes & Runny Nose     Review of Systems  Constitutional: Negative.   HENT: Negative.   Eyes: Negative.   Respiratory: Negative.   Cardiovascular: Negative.   Gastrointestinal: Negative.   Endocrine: Negative.   Genitourinary: Negative.   Musculoskeletal: Negative.   Skin: Negative.   Allergic/Immunologic: Negative.   Neurological: Negative.   Hematological: Negative.   Psychiatric/Behavioral: Negative.      Today's Vitals   10/25/18 1554  BP: 120/70  Pulse: 84  Temp: 98.4 F (36.9 C)  TempSrc: Oral  Weight: 155 lb 6.4 oz (70.5 kg)  Height: 5' 1.6" (1.565 m)  PainSc: 6   PainLoc: Hip   Body mass index is 28.79 kg/m.   Objective:  Physical Exam Constitutional:      General: She is not in acute distress.    Appearance: Normal appearance. She is well-developed.  HENT:     Head: Normocephalic and atraumatic.     Right Ear: Hearing, tympanic membrane, ear canal and external ear normal.     Left Ear: Hearing, tympanic membrane, ear canal and external ear normal.  Eyes:     General: Lids are normal.     Extraocular Movements: Extraocular movements intact.     Conjunctiva/sclera: Conjunctivae normal.     Pupils: Pupils are equal, round, and reactive to light.     Funduscopic exam:    Right eye: No papilledema.        Left eye: No papilledema.  Neck:     Musculoskeletal: Full passive range of motion without pain, normal range of motion and neck supple.     Thyroid: No thyroid mass.     Vascular: No carotid bruit.  Cardiovascular:     Rate and Rhythm: Normal rate and regular rhythm.     Pulses: Normal pulses.     Heart sounds: Normal heart sounds. No murmur.  Pulmonary:     Effort: Pulmonary effort is normal.     Breath sounds: Normal breath sounds.  Abdominal:     General: Abdomen is flat. Bowel sounds are normal.     Palpations: Abdomen is soft.  Musculoskeletal: Normal range of motion.        General: No swelling.     Right lower  leg: No edema.     Left lower leg: No edema.  Skin:    General: Skin is warm and dry.     Capillary Refill: Capillary refill takes less than 2 seconds.  Neurological:     General: No focal deficit present.     Mental Status: She is alert and oriented to person, place, and time.     Cranial Nerves: No cranial nerve deficit.     Sensory: No sensory deficit.  Psychiatric:        Mood and Affect: Mood normal.        Behavior: Behavior normal.        Thought Content: Thought content normal.        Judgment: Judgment normal.         Assessment And Plan:     1. Health maintenance examination . Behavior modifications discussed and diet history reviewed.   . Pt will continue to exercise regularly and modify diet with low GI, plant based foods and decrease intake of processed foods.  . Recommend intake of daily multivitamin, Vitamin D, and calcium.  . Recommend mammogram and colonoscopy for preventive screenings, as well as recommend immunizations that include TDAP. Influenza immunization was not given due to patient refusal.  2. Encounter for screening for malignant neoplasm of breast  Manual breast exam is normal  Will refer for mammogram Pt instructed on Self Breast Exam.According to ACOG guidelines Women aged 49 and older are recommended to get an annual mammogram. Form completed and given to patient contact the [facility] for appointment scheduing.  Pt encouraged to get annual mammogram - MM Digital Screening; Future  3. Decreased estrogen level  Order for bone density  Encouraged to continue with vitamin d - DG Bone Density; Future  4. Essential hypertension  Chronic,   Blood pressure is well controlled  EKG done no acute changes - POCT Urinalysis Dipstick (81002) - POCT UA - Microalbumin - EKG 12-Lead  5. Prediabetes  Chronic, controlled  No current medications, controlled with diet and exercise  Encouraged to limit intake of sugary foods and  drinks  Encouraged to increase physical activity to 150 minutes per week  6. Mixed hyperlipidemia  Chronic, controlled  no current medications, she is advised to avoid fried and fatty foods  7. Encounter for hepatitis C screening test for low risk patient Will check for Hepatitis C screening due to being born between the years [birth year]-1965 - Hepatitis C antibody  8. Encounter for immunization tetanus vaccine given today while in office. Refer to order management. TDAP will be administered to adults 73-32 years old every 10 years. Prevnar 13 sent to pharmacy - pneumococcal 13-valent conjugate vaccine (PREVNAR 13) SUSP injection; Inject  0.5 mLs into the muscle tomorrow at 10 am for 1 dose.  Dispense: 0.5 mL; Refill: 0 - Tdap (BOOSTRIX) injection 0.5 mL       [name], [name]    THE PATIENT IS ENCOURAGED TO PRACTICE SOCIAL DISTANCING DUE TO THE COVID-19 PANDEMIC.

## 2018-10-26 LAB — BMP8+EGFR
BUN/Creatinine Ratio: 23 (ref 12–28)
BUN: 16 mg/dL (ref 8–27)
CO2: 26 mmol/L (ref 20–29)
Calcium: 9.2 mg/dL (ref 8.7–10.3)
Chloride: 101 mmol/L (ref 96–106)
Creatinine, Ser: 0.7 mg/dL (ref 0.57–1.00)
GFR calc Af Amer: 104 mL/min/{1.73_m2} (ref >59)
GFR calc non Af Amer: 90 mL/min/{1.73_m2} (ref >59)
Glucose: 88 mg/dL (ref 65–99)
Potassium: 3.9 mmol/L (ref 3.5–5.2)
Sodium: 140 mmol/L (ref 134–144)

## 2018-10-26 LAB — CBC
Hematocrit: 39.7 % (ref 34.0–46.6)
Hemoglobin: 13 g/dL (ref 11.1–15.9)
MCH: 27 pg (ref 26.6–33.0)
MCHC: 32.7 g/dL (ref 31.5–35.7)
MCV: 83 fL (ref 79–97)
Platelets: 198 10*3/uL (ref 150–450)
RBC: 4.81 x10E6/uL (ref 3.77–5.28)
RDW: 13.3 % (ref 11.7–15.4)
WBC: 7.1 10*3/uL (ref 3.4–10.8)

## 2018-10-26 LAB — LIPID PANEL
Chol/HDL Ratio: 7.1 ratio — ABNORMAL HIGH (ref 0.0–4.4)
Cholesterol, Total: 233 mg/dL — ABNORMAL HIGH (ref 100–199)
HDL: 33 mg/dL — ABNORMAL LOW (ref >39)
LDL Chol Calc (NIH): 170 mg/dL — ABNORMAL HIGH (ref 0–99)
Triglycerides: 164 mg/dL — ABNORMAL HIGH (ref 0–149)
VLDL Cholesterol Cal: 30 mg/dL (ref 5–40)

## 2018-10-26 LAB — HEPATITIS C ANTIBODY: Hep C Virus Ab: 0.1 s/co ratio (ref 0.0–0.9)

## 2019-01-18 ENCOUNTER — Other Ambulatory Visit: Payer: Self-pay | Admitting: Nurse Practitioner

## 2019-03-28 ENCOUNTER — Other Ambulatory Visit: Payer: Self-pay | Admitting: Nurse Practitioner

## 2019-04-25 ENCOUNTER — Ambulatory Visit: Payer: BC Managed Care – PPO | Admitting: Nurse Practitioner

## 2019-05-09 ENCOUNTER — Ambulatory Visit: Payer: BC Managed Care – PPO | Admitting: Nurse Practitioner

## 2019-05-09 ENCOUNTER — Other Ambulatory Visit: Payer: Self-pay

## 2019-05-09 ENCOUNTER — Encounter: Payer: Self-pay | Admitting: Nurse Practitioner

## 2019-05-09 VITALS — BP 120/76 | HR 84 | Temp 98.7°F | Ht 61.6 in | Wt 148.8 lb

## 2019-05-09 DIAGNOSIS — E782 Mixed hyperlipidemia: Secondary | ICD-10-CM

## 2019-05-09 DIAGNOSIS — R7303 Prediabetes: Secondary | ICD-10-CM

## 2019-05-09 DIAGNOSIS — I10 Essential (primary) hypertension: Secondary | ICD-10-CM

## 2019-05-09 NOTE — Progress Notes (Signed)
This visit occurred during the SARS-CoV-2 public health emergency.  Safety protocols were in place, including screening questions prior to the visit, additional usage of staff PPE, and extensive cleaning of exam room while observing appropriate contact time as indicated for disinfecting solutions.  Subjective:     Patient ID: [name] , female    DOB: August 05, 1950 , 69 y.o.   MRN: [id number]   Chief Complaint  Patient presents with  . Hypertension    HPI  She has not taken her Covid vaccine.    Hypertension This is a chronic problem. The current episode started more than 1 year ago. The problem is controlled. Pertinent negatives include no anxiety, chest pain, headaches or palpitations. Risk factors for coronary artery disease include sedentary lifestyle and obesity. Past treatments include diuretics. There are no compliance problems.  There is no history of angina.     Past Medical History:  Diagnosis Date  . Arthritis    shoulder - no meds  . Hyperlipidemia   . Hypertension   . SVD (spontaneous vaginal delivery)    x 5     Family History  Problem Relation Age of Onset  . Diabetes Mother      Current Outpatient Medications:  .  hydrochlorothiazide (HYDRODIURIL) 25 MG tablet, Take 1 tablet by mouth once daily, Disp: 90 tablet, Rfl: 0 .  meloxicam (MOBIC) 7.5 MG tablet, TAKE 1 TABLET BY MOUTH ONCE DAILY AS NEEDED, Disp: 30 tablet, Rfl: 0   Allergies  Allergen Reactions  . Pollen Extract     Watery Eyes & Runny Nose     Review of Systems  Constitutional: Negative.   Respiratory: Negative.  Negative for cough.   Cardiovascular: Negative.  Negative for chest pain, palpitations and leg swelling.  Endocrine: Negative for polydipsia, polyphagia and polyuria.  Neurological: Negative for dizziness and headaches.  Psychiatric/Behavioral: Negative.      Today's Vitals   05/09/19 1533  BP: 120/76  Pulse: 84  Temp: 98.7 F (37.1 C)  TempSrc: Oral  SpO2: 96%   Weight: 148 lb 12.8 oz (67.5 kg)  Height: 5' 1.6" (1.565 m)   Body mass index is 27.57 kg/m.   Objective:  Physical Exam Constitutional:      General: She is not in acute distress.    Appearance: Normal appearance.  Cardiovascular:     Rate and Rhythm: Normal rate and regular rhythm.     Pulses: Normal pulses.     Heart sounds: Normal heart sounds. No murmur.  Pulmonary:     Effort: Pulmonary effort is normal. No respiratory distress.     Breath sounds: Normal breath sounds.  Neurological:     General: No focal deficit present.     Mental Status: She is alert and oriented to person, place, and time.     Cranial Nerves: No cranial nerve deficit.  Psychiatric:        Mood and Affect: Mood normal.        Behavior: Behavior normal.        Thought Content: Thought content normal.        Judgment: Judgment normal.         Assessment And Plan:     1. Essential hypertension Chronic, great control Continue with current medications - CMP14+EGFR  2. Prediabetes Chronic, encouraged to increase physical activity and eating a healthy diet low in sugar/starches - CMP14+EGFR  3. Mixed hyperlipidemia Chronic, has been continuously elevated She has been taking a supplement for cholesterol  lowering pending results will need to consider a statin for treatment - Lipid panel   [name], [name]    THE PATIENT IS ENCOURAGED TO PRACTICE SOCIAL DISTANCING DUE TO THE COVID-19 PANDEMIC.

## 2019-05-10 LAB — CMP14+EGFR
ALT: 16 IU/L (ref 0–32)
AST: 22 IU/L (ref 0–40)
Albumin/Globulin Ratio: 1.4 (ref 1.2–2.2)
Albumin: 4.1 g/dL (ref 3.8–4.8)
Alkaline Phosphatase: 73 IU/L (ref 39–117)
BUN/Creatinine Ratio: 19 (ref 12–28)
BUN: 13 mg/dL (ref 8–27)
Bilirubin Total: 0.3 mg/dL (ref 0.0–1.2)
CO2: 24 mmol/L (ref 20–29)
Calcium: 9.6 mg/dL (ref 8.7–10.3)
Chloride: 101 mmol/L (ref 96–106)
Creatinine, Ser: 0.7 mg/dL (ref 0.57–1.00)
GFR calc Af Amer: 103 mL/min/{1.73_m2} (ref >59)
GFR calc non Af Amer: 89 mL/min/{1.73_m2} (ref >59)
Globulin, Total: 2.9 g/dL (ref 1.5–4.5)
Glucose: 86 mg/dL (ref 65–99)
Potassium: 3.6 mmol/L (ref 3.5–5.2)
Sodium: 141 mmol/L (ref 134–144)
Total Protein: 7 g/dL (ref 6.0–8.5)

## 2019-05-10 LAB — LIPID PANEL
Chol/HDL Ratio: 6.3 ratio — ABNORMAL HIGH (ref 0.0–4.4)
Cholesterol, Total: 170 mg/dL (ref 100–199)
HDL: 27 mg/dL — ABNORMAL LOW (ref >39)
LDL Chol Calc (NIH): 123 mg/dL — ABNORMAL HIGH (ref 0–99)
Triglycerides: 109 mg/dL (ref 0–149)
VLDL Cholesterol Cal: 20 mg/dL (ref 5–40)

## 2019-06-20 ENCOUNTER — Other Ambulatory Visit: Payer: Self-pay | Admitting: Nurse Practitioner

## 2019-07-07 ENCOUNTER — Other Ambulatory Visit: Payer: Self-pay | Admitting: Nurse Practitioner

## 2019-08-14 ENCOUNTER — Other Ambulatory Visit: Payer: Self-pay

## 2019-08-14 ENCOUNTER — Emergency Department (HOSPITAL_COMMUNITY)
Admission: EM | Admit: 2019-08-14 | Discharge: 2019-08-14 | Disposition: A | Discharge status: Home / Self Care | Payer: BC Managed Care – PPO | Attending: Emergency Medicine | Admitting: Emergency Medicine

## 2019-08-14 ENCOUNTER — Emergency Department (HOSPITAL_COMMUNITY): Payer: BC Managed Care – PPO

## 2019-08-14 ENCOUNTER — Encounter (HOSPITAL_COMMUNITY): Payer: Self-pay

## 2019-08-14 DIAGNOSIS — I10 Essential (primary) hypertension: Secondary | ICD-10-CM | POA: Insufficient documentation

## 2019-08-14 DIAGNOSIS — Z79899 Other long term (current) drug therapy: Secondary | ICD-10-CM | POA: Diagnosis not present

## 2019-08-14 DIAGNOSIS — G5711 Meralgia paresthetica, right lower limb: Secondary | ICD-10-CM

## 2019-08-14 DIAGNOSIS — M5416 Radiculopathy, lumbar region: Secondary | ICD-10-CM | POA: Insufficient documentation

## 2019-08-14 DIAGNOSIS — M545 Low back pain: Secondary | ICD-10-CM | POA: Diagnosis not present

## 2019-08-14 DIAGNOSIS — R202 Paresthesia of skin: Secondary | ICD-10-CM | POA: Diagnosis not present

## 2019-08-14 DIAGNOSIS — M79604 Pain in right leg: Secondary | ICD-10-CM | POA: Diagnosis not present

## 2019-08-14 DIAGNOSIS — R52 Pain, unspecified: Secondary | ICD-10-CM | POA: Diagnosis not present

## 2019-08-14 MED ORDER — DEXAMETHASONE SODIUM PHOSPHATE 10 MG/ML IJ SOLN
10.0000 mg | Freq: Once | INTRAMUSCULAR | Status: AC
  Administered 2019-08-14: 10 mg via INTRAMUSCULAR
  Filled 2019-08-14: qty 1

## 2019-08-14 MED ORDER — CELECOXIB 200 MG PO CAPS
200.0000 mg | ORAL_CAPSULE | Freq: Two times a day (BID) | ORAL | 0 refills | Status: DC
Start: 2019-08-14 — End: 2021-03-11

## 2019-08-14 MED ORDER — METHOCARBAMOL 500 MG PO TABS
500.0000 mg | ORAL_TABLET | Freq: Two times a day (BID) | ORAL | 0 refills | Status: DC
Start: 2019-08-14 — End: 2019-10-30

## 2019-08-14 MED ORDER — KETOROLAC TROMETHAMINE 60 MG/2ML IM SOLN
30.0000 mg | Freq: Once | INTRAMUSCULAR | Status: AC
  Administered 2019-08-14: 30 mg via INTRAMUSCULAR
  Filled 2019-08-14: qty 2

## 2019-08-14 NOTE — Discharge Instructions (Addendum)
Contact a health care provider if: You have pain that: Wakes you up when you are sleeping. Gets worse when you lie down. Is worse than you have experienced in the past. Lasts longer than 4 weeks. You have an unexplained weight loss. Get help right away if: You are not able to control when you urinate or have bowel movements (incontinence). You have: Weakness in your lower back, pelvis, buttocks, or legs that gets worse. Redness or swelling of your back. A burning sensation when you urinate. 

## 2019-08-14 NOTE — ED Triage Notes (Signed)
Pt BIBA . Pt c/o back pain radiating down right leg x 2 days. Denies injury/trauma.   Pt did not take HCTZ today.

## 2019-08-14 NOTE — ED Provider Notes (Signed)
[facility] DEPT Provider Note   CSN: [id number] Arrival date & time: 08/14/19  1756     History Chief Complaint  Patient presents with  . Sciatica    [name] is a 69 y.o. female.  Who presents emergency department with chief complaint of back and leg pain.  Symptoms began 2 days ago.  She complains of aching pain in her right lower back.  She states that her pain becomes severe whenever she stands.  She feels sharp, shooting electrical type pain radiating around the front of her thigh.  She states that it is very difficult for her to walk.  She denies any new numbness or tingling.  She denies bowel or bladder incontinence.  She has had no injuries and has never had anything like this before.  HPI     Past Medical History:  Diagnosis Date  . Arthritis    shoulder - no meds  . Hyperlipidemia   . Hypertension   . SVD (spontaneous vaginal delivery)    x 5    Patient Active Problem List   Diagnosis Date Noted  . Essential hypertension 03/29/2018  . Mixed hyperlipidemia 03/29/2018  . Prediabetes 03/29/2018  . Pelvic relaxation 04/01/2013    Past Surgical History:  Procedure Laterality Date  . COLONOSCOPY    . CYSTOCELE REPAIR N/A 04/01/2013   Procedure: ANTERIOR REPAIR (CYSTOCELE) with Enterocele Repair;  Surgeon: Lovenia Kim, MD;  Location: [facility];  Service: Gynecology;  Laterality: N/A;  90 min.  Marland Kitchen RECTOCELE REPAIR N/A 04/01/2013   Procedure: POSTERIOR REPAIR (RECTOCELE);  Surgeon: Lovenia Kim, MD;  Location: [facility];  Service: Gynecology;  Laterality: N/A;  . TUBAL LIGATION    . VAGINAL HYSTERECTOMY N/A 04/01/2013   Procedure: HYSTERECTOMY TOTAL VAGINAL;  Surgeon: Lovenia Kim, MD;  Location: [facility];  Service: Gynecology;  Laterality: N/A;  . WISDOM TOOTH EXTRACTION       OB History   No obstetric history on file.     Family History  Problem Relation Age of Onset  . Diabetes Mother     Social History    Tobacco Use  . Smoking status: Never Smoker  . Smokeless tobacco: Never Used  Substance Use Topics  . Alcohol use: Yes    Comment: socially  . Drug use: No    Home Medications Prior to Admission medications   Medication Sig Start Date End Date Taking? Authorizing Provider  hydrochlorothiazide (HYDRODIURIL) 25 MG tablet Take 1 tablet by mouth once daily 07/07/19   [name], [name]  meloxicam (MOBIC) 7.5 MG tablet TAKE 1 TABLET BY MOUTH ONCE DAILY AS NEEDED 01/18/19   [name], [name]    Allergies    Pollen extract  Review of Systems   Review of Systems Ten systems reviewed and are negative for acute change, except as noted in the HPI.   Physical Exam Updated Vital Signs BP (!) 136/102 (BP Location: Left Arm)   Pulse 82   Temp 99.4 F (37.4 C) (Oral)   Resp 16   SpO2 99%   Physical Exam Vitals and nursing note reviewed.  Constitutional:      General: She is not in acute distress.    Appearance: She is well-developed. She is not diaphoretic.  HENT:     Head: Normocephalic and atraumatic.  Eyes:     General: No scleral icterus.    Conjunctiva/sclera: Conjunctivae normal.  Cardiovascular:     Rate and Rhythm: Normal rate and  regular rhythm.     Heart sounds: Normal heart sounds. No murmur heard.  No friction rub. No gallop.   Pulmonary:     Effort: Pulmonary effort is normal. No respiratory distress.     Breath sounds: Normal breath sounds.  Abdominal:     General: Bowel sounds are normal. There is no distension.     Palpations: Abdomen is soft. There is no mass.     Tenderness: There is no abdominal tenderness. There is no guarding.  Musculoskeletal:     Cervical back: Normal range of motion.     Comments: No midline spinal tenderness, negative straight leg test.  Tenderness palpation in the right lumbar paraspinal region that does not reproduce pain of complaint.  Severe pain causing patient to be unable to stand upright when trying to apply pressure to the  right leg.  No pain with internal and external passive rotation of the hip.  Skin:    General: Skin is warm and dry.  Neurological:     Mental Status: She is alert and oriented to person, place, and time.  Psychiatric:        Behavior: Behavior normal.     ED Results / Procedures / Treatments   Labs (all labs ordered are listed, but only abnormal results are displayed) Labs Reviewed - No data to display  EKG None  Radiology No results found.  Procedures Procedures (including critical care time)  Medications Ordered in ED Medications  ketorolac (TORADOL) injection 30 mg (30 mg Intramuscular Given 08/14/19 1925)  dexamethasone (DECADRON) injection 10 mg (10 mg Intramuscular Given 08/14/19 1927)    ED Course  I have reviewed the triage vital signs and the nursing notes.  Pertinent labs & imaging results that were available during my care of the patient were reviewed by me and considered in my medical decision making (see chart for details).    MDM Rules/Calculators/A&P                          69 year old female here with complaint of right sided back pain with shooting pain down the right anterior thigh consistent with L2/L3 radiculopathy (meralgia paresthetica).  I ordered and reviewed images of a lumbar film which shows dextroscoliosis with degenerative changes and likely is the cause of the radiculopathy.  Patient given Toradol and Decadron here with some improvement in her pain.  She feels comfortable being discharged at this point.  She was seen and shared visit with Dr. Kathrynn Humble who agrees with the work-up and plan for discharge.  Patient will be discharged with Celebrex and Robaxin.  Close PCP follow-up.  She has no evidence of cauda equina.  Discussed return precautions. Final Clinical Impression(s) / ED Diagnoses Final diagnoses:  None    Rx / DC Orders ED Discharge Orders    None       Margarita Mail, PA-C 08/16/19 [place], Ankit, MD 08/16/19  1541

## 2019-08-14 NOTE — ED Notes (Signed)
Abigail, PA at bedside 

## 2019-08-21 ENCOUNTER — Encounter: Payer: Self-pay | Admitting: Nurse Practitioner

## 2019-08-21 ENCOUNTER — Ambulatory Visit (INDEPENDENT_AMBULATORY_CARE_PROVIDER_SITE_OTHER): Payer: BC Managed Care – PPO | Admitting: Nurse Practitioner

## 2019-08-21 ENCOUNTER — Other Ambulatory Visit: Payer: Self-pay

## 2019-08-21 VITALS — BP 118/68 | HR 83 | Temp 98.5°F

## 2019-08-21 DIAGNOSIS — M5441 Lumbago with sciatica, right side: Secondary | ICD-10-CM | POA: Diagnosis not present

## 2019-08-21 MED ORDER — TRIAMCINOLONE ACETONIDE 40 MG/ML IJ SUSP
40.0000 mg | Freq: Once | INTRAMUSCULAR | Status: AC
Start: 2019-08-21 — End: 2019-08-21
  Administered 2019-08-21: 40 mg via INTRAMUSCULAR

## 2019-08-21 MED ORDER — KETOROLAC TROMETHAMINE 30 MG/ML IJ SOLN
30.0000 mg | Freq: Once | INTRAMUSCULAR | Status: AC
  Administered 2019-08-21: 30 mg via INTRAMUSCULAR

## 2019-08-21 MED ORDER — TRAMADOL HCL 50 MG PO TABS: 50.0000 mg | ORAL_TABLET | Freq: Four times a day (QID) | ORAL | 0 refills | Status: DC | PRN

## 2019-08-21 NOTE — Patient Instructions (Addendum)
Sciatica Rehab Ask your health care provider which exercises are safe for you. Do exercises exactly as told by your health care provider and adjust them as directed. It is normal to feel mild stretching, pulling, tightness, or discomfort as you do these exercises. Stop right away if you feel sudden pain or your pain gets worse. Do not begin these exercises until told by your health care provider. Stretching and range-of-motion exercises These exercises warm up your muscles and joints and improve the movement and flexibility of your hips and back. These exercises also help to relieve pain, numbness, and tingling. Sciatic nerve glide 1. Sit in a chair with your head facing down toward your chest. Place your hands behind your back. Let your shoulders slump forward. 2. Slowly straighten one of your legs while you tilt your head back as if you are looking toward the ceiling. Only straighten your leg as far as you can without making your symptoms worse. 3. Hold this position for __________ seconds. 4. Slowly return to the starting position. 5. Repeat with your other leg. Repeat __________ times. Complete this exercise __________ times a day. Knee to chest with hip adduction and internal rotation  1. Lie on your back on a firm surface with both legs straight. 2. Bend one of your knees and move it up toward your chest until you feel a gentle stretch in your lower back and buttock. Then, move your knee toward the shoulder that is on the opposite side from your leg. This is hip adduction and internal rotation. ? Hold your leg in this position by holding on to the front of your knee. 3. Hold this position for __________ seconds. 4. Slowly return to the starting position. 5. Repeat with your other leg. Repeat __________ times. Complete this exercise __________ times a day. Prone extension on elbows  1. Lie on your abdomen on a firm surface. A bed may be too soft for this exercise. 2. Prop yourself up on  your elbows. 3. Use your arms to help lift your chest up until you feel a gentle stretch in your abdomen and your lower back. ? This will place some of your body weight on your elbows. If this is uncomfortable, try stacking pillows under your chest. ? Your hips should stay down, against the surface that you are lying on. Keep your hip and back muscles relaxed. 4. Hold this position for __________ seconds. 5. Slowly relax your upper body and return to the starting position. Repeat __________ times. Complete this exercise __________ times a day. Strengthening exercises These exercises build strength and endurance in your back. Endurance is the ability to use your muscles for a long time, even after they get tired. Pelvic tilt This exercise strengthens the muscles that lie deep in the abdomen. 1. Lie on your back on a firm surface. Bend your knees and keep your feet flat on the floor. 2. Tense your abdominal muscles. Tip your pelvis up toward the ceiling and flatten your lower back into the floor. ? To help with this exercise, you may place a small towel under your lower back and try to push your back into the towel. 3. Hold this position for __________ seconds. 4. Let your muscles relax completely before you repeat this exercise. Repeat __________ times. Complete this exercise __________ times a day. Alternating arm and leg raises  1. Get on your hands and knees on a firm surface. If you are on a hard floor, you may want to use   padding, such as an exercise mat, to cushion your knees. 2. Line up your arms and legs. Your hands should be directly below your shoulders, and your knees should be directly below your hips. 3. Lift your left leg behind you. At the same time, raise your right arm and straighten it in front of you. ? Do not lift your leg higher than your hip. ? Do not lift your arm higher than your shoulder. ? Keep your abdominal and back muscles tight. ? Keep your hips facing the  ground. ? Do not arch your back. ? Keep your balance carefully, and do not hold your breath. 4. Hold this position for __________ seconds. 5. Slowly return to the starting position. 6. Repeat with your right leg and your left arm. Repeat __________ times. Complete this exercise __________ times a day. Posture and body mechanics Good posture and healthy body mechanics can help to relieve stress in your body's tissues and joints. Body mechanics refers to the movements and positions of your body while you do your daily activities. Posture is part of body mechanics. Good posture means:  Your spine is in its natural S-curve position (neutral).  Your shoulders are pulled back slightly.  Your head is not tipped forward. Follow these guidelines to improve your posture and body mechanics in your everyday activities. Standing   When standing, keep your spine neutral and your feet about hip width apart. Keep a slight bend in your knees. Your ears, shoulders, and hips should line up.  When you do a task in which you stand in one place for a long time, place one foot up on a stable object that is 2-4 inches (5-10 cm) high, such as a footstool. This helps keep your spine neutral. Sitting   When sitting, keep your spine neutral and keep your feet flat on the floor. Use a footrest, if necessary, and keep your thighs parallel to the floor. Avoid rounding your shoulders, and avoid tilting your head forward.  When working at a desk or a computer, keep your desk at a height where your hands are slightly lower than your elbows. Slide your chair under your desk so you are close enough to maintain good posture.  When working at a computer, place your monitor at a height where you are looking straight ahead and you do not have to tilt your head forward or downward to look at the screen. Resting  When lying down and resting, avoid positions that are most painful for you.  If you have pain with activities  such as sitting, bending, stooping, or squatting, lie in a position in which your body does not bend very much. For example, avoid curling up on your side with your arms and knees near your chest (fetal position).  If you have pain with activities such as standing for a long time or reaching with your arms, lie with your spine in a neutral position and bend your knees slightly. Try the following positions: ? Lying on your side with a pillow between your knees. ? Lying on your back with a pillow under your knees. Lifting   When lifting objects, keep your feet at least shoulder width apart and tighten your abdominal muscles.  Bend your knees and hips and keep your spine neutral. It is important to lift using the strength of your legs, not your back. Do not lock your knees straight out.  Always ask for help to lift heavy or awkward objects. This information is not   intended to replace advice given to you by your health care provider. Make sure you discuss any questions you have with your health care provider. Document Revised: 05/25/2018 Document Reviewed: 02/22/2018 Elsevier Patient Education  [place]. Acute Back Pain, Adult Acute back pain is sudden and usually short-lived. It is often caused by an injury to the muscles and tissues in the back. The injury may result from:  A muscle or ligament getting overstretched or torn (strained). Ligaments are tissues that connect bones to each other. Lifting something improperly can cause a back strain.  Wear and tear (degeneration) of the spinal disks. Spinal disks are circular tissue that provides cushioning between the bones of the spine (vertebrae).  Twisting motions, such as while playing sports or doing yard work.  A hit to the back.  Arthritis. You may have a physical exam, lab tests, and imaging tests to find the cause of your pain. Acute back pain usually goes away with rest and home care. Follow these instructions at  home: Managing pain, stiffness, and swelling  Take over-the-counter and prescription medicines only as told by your health care provider.  Your health care provider may recommend applying ice during the first 24-48 hours after your pain starts. To do this: ? Put ice in a plastic bag. ? Place a towel between your skin and the bag. ? Leave the ice on for 20 minutes, 2-3 times a day.  If directed, apply heat to the affected area as often as told by your health care provider. Use the heat source that your health care provider recommends, such as a moist heat pack or a heating pad. ? Place a towel between your skin and the heat source. ? Leave the heat on for 20-30 minutes. ? Remove the heat if your skin turns bright red. This is especially important if you are unable to feel pain, heat, or cold. You have a greater risk of getting burned. Activity   Do not stay in bed. Staying in bed for more than 1-2 days can delay your recovery.  Sit up and stand up straight. Avoid leaning forward when you sit, or hunching over when you stand. ? If you work at a desk, sit close to it so you do not need to lean over. Keep your chin tucked in. Keep your neck drawn back, and keep your elbows bent at a right angle. Your arms should look like the letter "L." ? Sit high and close to the steering wheel when you drive. Add lower back (lumbar) support to your car seat, if needed.  Take short walks on even surfaces as soon as you are able. Try to increase the length of time you walk each day.  Do not sit, drive, or stand in one place for more than 30 minutes at a time. Sitting or standing for long periods of time can put stress on your back.  Do not drive or use heavy machinery while taking prescription pain medicine.  Use proper lifting techniques. When you bend and lift, use positions that put less stress on your back: ? [place] your knees. ? Keep the load close to your body. ? Avoid twisting.  Exercise regularly  as told by your health care provider. Exercising helps your back heal faster and helps prevent back injuries by keeping muscles strong and flexible.  Work with a physical therapist to make a safe exercise program, as recommended by your health care provider. Do any exercises as told by your physical  therapist. Lifestyle  Maintain a healthy weight. Extra weight puts stress on your back and makes it difficult to have good posture.  Avoid activities or situations that make you feel anxious or stressed. Stress and anxiety increase muscle tension and can make back pain worse. Learn ways to manage anxiety and stress, such as through exercise. General instructions  Sleep on a firm mattress in a comfortable position. Try lying on your side with your knees slightly bent. If you lie on your back, put a pillow under your knees.  Follow your treatment plan as told by your health care provider. This may include: ? Cognitive or behavioral therapy. ? Acupuncture or massage therapy. ? Meditation or yoga. Contact a health care provider if:  You have pain that is not relieved with rest or medicine.  You have increasing pain going down into your legs or buttocks.  Your pain does not improve after 2 weeks.  You have pain at night.  You lose weight without trying.  You have a fever or chills. Get help right away if:  You develop new bowel or bladder control problems.  You have unusual weakness or numbness in your arms or legs.  You develop nausea or vomiting.  You develop abdominal pain.  You feel faint. Summary  Acute back pain is sudden and usually short-lived.  Use proper lifting techniques. When you bend and lift, use positions that put less stress on your back.  Take over-the-counter and prescription medicines and apply heat or ice as directed by your health care provider. This information is not intended to replace advice given to you by your health care provider. Make sure you  discuss any questions you have with your health care provider. Document Revised: 05/22/2018 Document Reviewed: 09/14/2016 Elsevier Patient Education  [place].   Get lidocaine patch to place on area.

## 2019-08-21 NOTE — Progress Notes (Signed)
This visit occurred during the SARS-CoV-2 public health emergency.  Safety protocols were in place, including screening questions prior to the visit, additional usage of staff PPE, and extensive cleaning of exam room while observing appropriate contact time as indicated for disinfecting solutions.  Subjective:     Patient ID: [name] , female    DOB: [birth year] , 69 y.o.   MRN: [id number]   Chief Complaint  Patient presents with  . Back Pain    HPI  She had the Covid vaccine on Sunday then on Monday she went to work,  The Thursday before she drove 4 hours.  On Monday she has pain in her left leg and took aleve, continued working, then ate supper.  When she got up that morning she was unable to walk, went to ER that evening by EMS was given 2 shots. Had xray done and was told her sciatic nerve.   Back Pain This is a new problem. The current episode started in the past 7 days. The problem occurs constantly. The problem has been gradually worsening since onset. The pain is present in the gluteal and lumbar spine. The quality of the pain is described as aching and shooting. Radiates to: right leg. The symptoms are aggravated by sitting. Pertinent negatives include no chest pain, dysuria or numbness. Risk factors include sedentary lifestyle and lack of exercise. She has tried NSAIDs and analgesics for the symptoms. The treatment provided no relief.     Past Medical History:  Diagnosis Date  . Arthritis    shoulder - no meds  . Hyperlipidemia   . Hypertension   . SVD (spontaneous vaginal delivery)    x 5     Family History  Problem Relation Age of Onset  . Diabetes Mother      Current Outpatient Medications:  .  celecoxib (CELEBREX) 200 MG capsule, Take 1 capsule (200 mg total) by mouth 2 (two) times daily., Disp: 20 capsule, Rfl: 0 .  hydrochlorothiazide (HYDRODIURIL) 25 MG tablet, Take 1 tablet by mouth once daily, Disp: 90 tablet, Rfl: 0 .  meloxicam (MOBIC) 7.5 MG  tablet, TAKE 1 TABLET BY MOUTH ONCE DAILY AS NEEDED, Disp: 30 tablet, Rfl: 0 .  methocarbamol (ROBAXIN) 500 MG tablet, Take 1 tablet (500 mg total) by mouth 2 (two) times daily., Disp: 20 tablet, Rfl: 0   Allergies  Allergen Reactions  . Pollen Extract     Watery Eyes & Runny Nose     Review of Systems  Cardiovascular: Negative for chest pain.  Genitourinary: Negative for dysuria.  Musculoskeletal: Positive for back pain. Negative for arthralgias.  Neurological: Negative for numbness.  Psychiatric/Behavioral: Negative.      Today's Vitals   08/21/19 1027  BP: 118/68  Pulse: 83  Temp: 98.5 F (36.9 C)  TempSrc: Oral  PainSc: 10-Worst pain ever  PainLoc: Hip   There is no height or weight on file to calculate BMI.   Objective:  Physical Exam Constitutional:      General: She is not in acute distress.    Appearance: Normal appearance.     Comments: Appears uncomfortable  Cardiovascular:     Rate and Rhythm: Normal rate and regular rhythm.  Pulmonary:     Effort: Pulmonary effort is normal.     Breath sounds: Normal breath sounds.  Musculoskeletal:        General: Tenderness (pain to right low back and hip posteriorly) present.     Comments: Standing up by bed upon  walking into room  Skin:    Capillary Refill: Capillary refill takes less than 2 seconds.  Neurological:     General: No focal deficit present.     Mental Status: She is alert and oriented to person, place, and time.     Cranial Nerves: No cranial nerve deficit.  Psychiatric:        Mood and Affect: Mood normal.        Behavior: Behavior normal.        Thought Content: Thought content normal.        Judgment: Judgment normal.         Assessment And Plan:     1. Acute right-sided low back pain with right-sided sciatica  Will treat with another round of steroids and antiinflammatory  Exercises for her back given  If not better in next couple days will consider referral to PT - ketorolac  (TORADOL) 30 MG/ML injection 30 mg - traMADol (ULTRAM) 50 MG tablet; Take 1 tablet (50 mg total) by mouth every 6 (six) hours as needed.  Dispense: 20 tablet; Refill: 0 - triamcinolone acetonide (KENALOG-40) injection 40 mg   [name], [name]    THE PATIENT IS ENCOURAGED TO PRACTICE SOCIAL DISTANCING DUE TO THE COVID-19 PANDEMIC.

## 2019-08-29 ENCOUNTER — Encounter: Payer: Self-pay | Admitting: Nurse Practitioner

## 2019-08-29 ENCOUNTER — Telehealth: Payer: Self-pay

## 2019-08-29 NOTE — Telephone Encounter (Signed)
Patient called wanting to know if we could send her some medicine to help her back. She stated the tramadol isnt working she is still having pain. I advised her that [name] is going to send her a new medication and also refer her to [facility]. [name]

## 2019-09-03 ENCOUNTER — Other Ambulatory Visit: Payer: Self-pay | Admitting: Nurse Practitioner

## 2019-09-03 ENCOUNTER — Telehealth: Payer: Self-pay

## 2019-09-03 ENCOUNTER — Encounter: Payer: Self-pay | Admitting: Nurse Practitioner

## 2019-09-03 DIAGNOSIS — M5441 Lumbago with sciatica, right side: Secondary | ICD-10-CM

## 2019-09-03 MED ORDER — HYDROCODONE-ACETAMINOPHEN 5-325 MG PO TABS: 1.0000 | ORAL_TABLET | Freq: Four times a day (QID) | ORAL | 0 refills | Status: DC | PRN

## 2019-09-03 NOTE — Telephone Encounter (Signed)
Per JM write pt a note to be out of work until 09/16/2019 spoke w/pt left note up front for daughter to pick up

## 2019-09-16 ENCOUNTER — Encounter: Payer: Self-pay | Admitting: Nurse Practitioner

## 2019-10-07 DIAGNOSIS — M79604 Pain in right leg: Secondary | ICD-10-CM | POA: Diagnosis not present

## 2019-10-09 DIAGNOSIS — M79604 Pain in right leg: Secondary | ICD-10-CM | POA: Diagnosis not present

## 2019-10-10 ENCOUNTER — Telehealth: Payer: Self-pay

## 2019-10-10 NOTE — Telephone Encounter (Signed)
I called patient to notify her that her forms are completed and have also been faxed and placed in the bin up front for her to pick up. YL,RMA

## 2019-10-14 DIAGNOSIS — M79604 Pain in right leg: Secondary | ICD-10-CM | POA: Diagnosis not present

## 2019-10-16 DIAGNOSIS — M79604 Pain in right leg: Secondary | ICD-10-CM | POA: Diagnosis not present

## 2019-10-23 DIAGNOSIS — M79604 Pain in right leg: Secondary | ICD-10-CM | POA: Diagnosis not present

## 2019-10-28 ENCOUNTER — Other Ambulatory Visit: Payer: Self-pay | Admitting: Nurse Practitioner

## 2019-10-28 DIAGNOSIS — M79604 Pain in right leg: Secondary | ICD-10-CM | POA: Diagnosis not present

## 2019-10-30 ENCOUNTER — Encounter: Payer: Self-pay | Admitting: Nurse Practitioner

## 2019-10-30 NOTE — Progress Notes (Signed)
[name] as a scribe for Helen Brine, [name].,have documented all relevant documentation on the behalf of Helen Brine, [name],as directed by  Helen Brine, [name] while in the presence of [name], [name]. This visit occurred during the SARS-CoV-2 public health emergency.  Safety protocols were in place, including screening questions prior to the visit, additional usage of staff PPE, and extensive cleaning of exam room while observing appropriate contact time as indicated for disinfecting solutions.  Subjective:     Patient ID: [name] , female    DOB: 06/12/1950 , 69 y.o.   MRN: [id number]   Chief Complaint  Patient presents with  . Annual Exam  . Insomnia    wants something to help her sleep   . Hypertension    HPI  Pt is here today for HM exam Wt Readings from Last 3 Encounters: 10/31/19 : 142 lb 9.6 oz (64.7 kg) 05/09/19 : 148 lb 12.8 oz (67.5 kg) 10/25/18 : 155 lb 6.4 oz (70.5 kg)      Past Medical History:  Diagnosis Date  . Arthritis    shoulder - no meds  . Hyperlipidemia   . Hypertension   . SVD (spontaneous vaginal delivery)    x 5     Family History  Problem Relation Age of Onset  . Diabetes Mother      Current Outpatient Medications:  .  celecoxib (CELEBREX) 200 MG capsule, Take 1 capsule (200 mg total) by mouth 2 (two) times daily., Disp: 20 capsule, Rfl: 0 .  hydrochlorothiazide (HYDRODIURIL) 25 MG tablet, Take 1 tablet by mouth once daily, Disp: 90 tablet, Rfl: 0 .  traZODone (DESYREL) 50 MG tablet, Take 1 tablet (50 mg total) by mouth at bedtime as needed for sleep., Disp: 30 tablet, Rfl: 2   Allergies  Allergen Reactions  . Pollen Extract     Watery Eyes & Runny Nose      The patient states she is status post hysterectomy . Negative for Dysmenorrhea and Negative for Menorrhagia. Negative for: breast discharge, breast lump(s), breast pain and breast self exam. Associated symptoms include abnormal vaginal bleeding. Pertinent  negatives include abnormal bleeding (hematology), anxiety, decreased libido, depression, difficulty falling sleep, dyspareunia, history of infertility, nocturia, sexual dysfunction, sleep disturbances, urinary incontinence, urinary urgency, vaginal discharge and vaginal itching. Diet regular; tries to eat healthy. The patient states her exercise level is moderate with walking.    Continues with physical therapy two times a week.  The patient's tobacco use is:  Social History   Tobacco Use  Smoking Status Never Smoker  Smokeless Tobacco Never Used   She has been exposed to passive smoke. The patient's alcohol use is:  Social History   Substance and Sexual Activity  Alcohol Use Yes   Comment: socially    Review of Systems  Constitutional: Negative.  Negative for fatigue.  HENT: Negative.   Eyes: Negative.   Respiratory: Negative.  Negative for cough.   Cardiovascular: Negative.   Gastrointestinal: Negative.   Endocrine: Negative for polydipsia, polyphagia and polyuria.  Genitourinary: Negative.   Musculoskeletal: Negative.   Skin: Negative.   Neurological: Negative for dizziness and headaches.  Hematological: Negative.   Psychiatric/Behavioral: Positive for sleep disturbance (insomnia).     Today's Vitals   10/31/19 1523  BP: 116/68  Pulse: 70  Temp: 98.8 F (37.1 C)  TempSrc: Oral  Weight: 142 lb 9.6 oz (64.7 kg)  Height: 5' 1.6" (1.565 m)  PainSc: 0-No pain   Body mass  index is 26.42 kg/m.   Objective:  Physical Exam Constitutional:      General: She is not in acute distress.    Appearance: Normal appearance. She is well-developed. She is obese.  HENT:     Head: Normocephalic and atraumatic.     Right Ear: Hearing normal.     Left Ear: Hearing normal.     Nose:     Comments: Deferred - masked    Mouth/Throat:     Comments: Deferred - masked Eyes:     General: Lids are normal.     Extraocular Movements: Extraocular movements intact.      Conjunctiva/sclera: Conjunctivae normal.     Pupils: Pupils are equal, round, and reactive to light.     Funduscopic exam:    Right eye: No papilledema.        Left eye: No papilledema.  Neck:     Thyroid: No thyroid mass.     Vascular: No carotid bruit.  Cardiovascular:     Rate and Rhythm: Normal rate and regular rhythm.     Pulses: Normal pulses.     Heart sounds: Normal heart sounds. No murmur heard.   Pulmonary:     Effort: Pulmonary effort is normal.     Breath sounds: Normal breath sounds.  Abdominal:     General: Abdomen is flat. Bowel sounds are normal. There is no distension.     Palpations: Abdomen is soft.     Tenderness: There is no abdominal tenderness.  Genitourinary:    Rectum: Guaiac result negative.  Musculoskeletal:        General: No swelling. Normal range of motion.     Cervical back: Full passive range of motion without pain, normal range of motion and neck supple.     Right lower leg: No edema.     Left lower leg: No edema.  Skin:    General: Skin is warm and dry.     Capillary Refill: Capillary refill takes less than 2 seconds.  Neurological:     General: No focal deficit present.     Mental Status: She is alert and oriented to person, place, and time.     Cranial Nerves: No cranial nerve deficit.     Sensory: No sensory deficit.  Psychiatric:        Mood and Affect: Mood normal.        Behavior: Behavior normal.        Thought Content: Thought content normal.        Judgment: Judgment normal.         Assessment And Plan:     1. Health maintenance examination . Behavior modifications discussed and diet history reviewed.   . Pt will continue to exercise regularly and modify diet with low GI, plant based foods and decrease intake of processed foods.  . Recommend intake of daily multivitamin, Vitamin D, and calcium.  . Recommend mammogram for preventive screenings, as well as recommend immunizations that include influenza, TDAP (up to date) - she  has not had her second covid vaccine encouraged to get as soon as possible. - CBC  2. Encounter for screening mammogram for malignant neoplasm of breast  Pt instructed on Self Breast Exam.According to ACOG guidelines Women aged 40 and older are recommended to get an annual mammogram. Form completed and given to patient contact the [facility] for appointment scheduling.   Pt encouraged to get annual mammogram - MM DIGITAL SCREENING BILATERAL; Future  3. Essential hypertension .  B/P is well controlled.  . CMP ordered to check renal function.  . The importance of regular exercise and dietary modification was stressed to the patient.  . EKG done with NSR - POCT Urinalysis Dipstick (81002) - POCT UA - Microalbumin - EKG 12-Lead - CMP14+EGFR  4. Decreased estrogen level  Encouraged to have her bone density done, order [facility] to [facility] - DG Bone Density; Future  5. Mixed hyperlipidemia  Chronic, stable.  A low fat, low cholesterol is discussed with the patient - Hemoglobin A1c - Lipid panel  6. Prediabetes  Chronic, stable  no current medications  Encouraged to limit intake of sugary foods and drinks  Encouraged to increase physical activity to 150 minutes per week as tolerated - currently going to physical therapy for her back pain  7. Insomnia, unspecified type  Will try her on trazodone as over the counter has not been effective.  - traZODone (DESYREL) 50 MG tablet; Take 1 tablet (50 mg total) by mouth at bedtime as needed for sleep.  Dispense: 30 tablet; Refill: 2   Patient was given opportunity to ask questions. Patient verbalized understanding of the plan and was able to repeat key elements of the plan. All questions were answered to their satisfaction.   Teola Bradley, [name], have reviewed all documentation for this visit. The documentation on 11/08/19 for the exam, diagnosis, procedures, and orders are all accurate and complete.   THE PATIENT IS  ENCOURAGED TO PRACTICE SOCIAL DISTANCING DUE TO THE COVID-19 PANDEMIC.

## 2019-10-31 ENCOUNTER — Ambulatory Visit (INDEPENDENT_AMBULATORY_CARE_PROVIDER_SITE_OTHER): Payer: BC Managed Care – PPO | Admitting: Nurse Practitioner

## 2019-10-31 ENCOUNTER — Other Ambulatory Visit: Payer: Self-pay

## 2019-10-31 ENCOUNTER — Encounter: Payer: Self-pay | Admitting: Nurse Practitioner

## 2019-10-31 VITALS — BP 116/68 | HR 70 | Temp 98.8°F | Ht 61.6 in | Wt 142.6 lb

## 2019-10-31 DIAGNOSIS — E782 Mixed hyperlipidemia: Secondary | ICD-10-CM | POA: Diagnosis not present

## 2019-10-31 DIAGNOSIS — R7303 Prediabetes: Secondary | ICD-10-CM

## 2019-10-31 DIAGNOSIS — G4700 Insomnia, unspecified: Secondary | ICD-10-CM

## 2019-10-31 DIAGNOSIS — Z0000 Encounter for general adult medical examination without abnormal findings: Secondary | ICD-10-CM | POA: Diagnosis not present

## 2019-10-31 DIAGNOSIS — E2839 Other primary ovarian failure: Secondary | ICD-10-CM

## 2019-10-31 DIAGNOSIS — Z1231 Encounter for screening mammogram for malignant neoplasm of breast: Secondary | ICD-10-CM

## 2019-10-31 DIAGNOSIS — I10 Essential (primary) hypertension: Secondary | ICD-10-CM | POA: Diagnosis not present

## 2019-10-31 LAB — POCT URINALYSIS DIPSTICK
Bilirubin, UA: NEGATIVE
Blood, UA: NEGATIVE
Glucose, UA: NEGATIVE
Ketones, UA: NEGATIVE
Leukocytes, UA: NEGATIVE
Nitrite, UA: NEGATIVE
Protein, UA: NEGATIVE
Spec Grav, UA: <=1.005 — AB (ref 1.010–1.025)
Urobilinogen, UA: 0.2 E.U./dL
pH, UA: 6 (ref 5.0–8.0)

## 2019-10-31 LAB — POCT UA - MICROALBUMIN
Albumin/Creatinine Ratio, Urine, POC: 30
Creatinine, POC: 50 mg/dL
Microalbumin Ur, POC: 10 mg/L

## 2019-10-31 MED ORDER — TRAZODONE HCL 50 MG PO TABS: 50.0000 mg | ORAL_TABLET | Freq: Every evening | ORAL | 2 refills | Status: DC | PRN

## 2019-10-31 MED ORDER — PNEUMOCOCCAL 13-VAL CONJ VACC IM SUSP: 0.5000 mL | INTRAMUSCULAR | 0 refills | Status: AC

## 2019-10-31 NOTE — Patient Instructions (Signed)
Health Maintenance, Female Adopting a healthy lifestyle and getting preventive care are important in promoting health and wellness. Ask your health care provider about:  The right schedule for you to have regular tests and exams.  Things you can do on your own to prevent diseases and keep yourself healthy. What should I know about diet, weight, and exercise? Eat a healthy diet   Eat a diet that includes plenty of vegetables, fruits, low-fat dairy products, and lean protein.  Do not eat a lot of foods that are high in solid fats, added sugars, or sodium. Maintain a healthy weight Body mass index (BMI) is used to identify weight problems. It estimates body fat based on height and weight. Your health care provider can help determine your BMI and help you achieve or maintain a healthy weight. Get regular exercise Get regular exercise. This is one of the most important things you can do for your health. Most adults should:  Exercise for at least 150 minutes each week. The exercise should increase your heart rate and make you sweat (moderate-intensity exercise).  Do strengthening exercises at least twice a week. This is in addition to the moderate-intensity exercise.  Spend less time sitting. Even light physical activity can be beneficial. Watch cholesterol and blood lipids Have your blood tested for lipids and cholesterol at 68 years of age, then have this test every 5 years. Have your cholesterol levels checked more often if:  Your lipid or cholesterol levels are high.  You are older than 68 years of age.  You are at high risk for heart disease. What should I know about cancer screening? Depending on your health history and family history, you may need to have cancer screening at various ages. This may include screening for:  Breast cancer.  Cervical cancer.  Colorectal cancer.  Skin cancer.  Lung cancer. What should I know about heart disease, diabetes, and high blood  pressure? Blood pressure and heart disease  High blood pressure causes heart disease and increases the risk of stroke. This is more likely to develop in people who have high blood pressure readings, are of African descent, or are overweight.  Have your blood pressure checked: ? Every 3-5 years if you are 18-39 years of age. ? Every year if you are 40 years old or older. Diabetes Have regular diabetes screenings. This checks your fasting blood sugar level. Have the screening done:  Once every three years after age 40 if you are at a normal weight and have a low risk for diabetes.  More often and at a younger age if you are overweight or have a high risk for diabetes. What should I know about preventing infection? Hepatitis B If you have a higher risk for hepatitis B, you should be screened for this virus. Talk with your health care provider to find out if you are at risk for hepatitis B infection. Hepatitis C Testing is recommended for:  Everyone born from 1945 through 1965.  Anyone with known risk factors for hepatitis C. Sexually transmitted infections (STIs)  Get screened for STIs, including gonorrhea and chlamydia, if: ? You are sexually active and are younger than 68 years of age. ? You are older than 68 years of age and your health care provider tells you that you are at risk for this type of infection. ? Your sexual activity has changed since you were last screened, and you are at increased risk for chlamydia or gonorrhea. Ask your health care provider if   you are at risk.  Ask your health care provider about whether you are at high risk for HIV. Your health care provider may recommend a prescription medicine to help prevent HIV infection. If you choose to take medicine to prevent HIV, you should first get tested for HIV. You should then be tested every 3 months for as long as you are taking the medicine. Pregnancy  If you are about to stop having your period (premenopausal) and  you may become pregnant, seek counseling before you get pregnant.  Take 400 to 800 micrograms (mcg) of folic acid every day if you become pregnant.  Ask for birth control (contraception) if you want to prevent pregnancy. Osteoporosis and menopause Osteoporosis is a disease in which the bones lose minerals and strength with aging. This can result in bone fractures. If you are 65 years old or older, or if you are at risk for osteoporosis and fractures, ask your health care provider if you should:  Be screened for bone loss.  Take a calcium or vitamin D supplement to lower your risk of fractures.  Be given hormone replacement therapy (HRT) to treat symptoms of menopause. Follow these instructions at home: Lifestyle  Do not use any products that contain nicotine or tobacco, such as cigarettes, e-cigarettes, and chewing tobacco. If you need help quitting, ask your health care provider.  Do not use street drugs.  Do not share needles.  Ask your health care provider for help if you need support or information about quitting drugs. Alcohol use  Do not drink alcohol if: ? Your health care provider tells you not to drink. ? You are pregnant, may be pregnant, or are planning to become pregnant.  If you drink alcohol: ? Limit how much you use to 0-1 drink a day. ? Limit intake if you are breastfeeding.  Be aware of how much alcohol is in your drink. In the [place] one drink equals one 12 oz bottle of beer (355 mL), one 5 oz glass of wine (148 mL), or one 1 oz glass of hard liquor (44 mL). General instructions  Schedule regular health, dental, and eye exams.  Stay current with your vaccines.  Tell your health care provider if: ? You often feel depressed. ? You have ever been abused or do not feel safe at home. Summary  Adopting a healthy lifestyle and getting preventive care are important in promoting health and wellness.  Follow your health care provider's instructions about healthy  diet, exercising, and getting tested or screened for diseases.  Follow your health care provider's instructions on monitoring your cholesterol and blood pressure. This information is not intended to replace advice given to you by your health care provider. Make sure you discuss any questions you have with your health care provider. Document Revised: 01/24/2018 Document Reviewed: 01/24/2018 Elsevier Patient Education  2020 [employer]  

## 2019-11-01 LAB — CMP14+EGFR
ALT: 14 IU/L (ref 0–32)
AST: 20 IU/L (ref 0–40)
Albumin/Globulin Ratio: 1.6 (ref 1.2–2.2)
Albumin: 4.4 g/dL (ref 3.8–4.8)
Alkaline Phosphatase: 86 IU/L (ref 44–121)
BUN/Creatinine Ratio: 17 (ref 12–28)
BUN: 12 mg/dL (ref 8–27)
Bilirubin Total: 0.3 mg/dL (ref 0.0–1.2)
CO2: 28 mmol/L (ref 20–29)
Calcium: 9.3 mg/dL (ref 8.7–10.3)
Chloride: 99 mmol/L (ref 96–106)
Creatinine, Ser: 0.72 mg/dL (ref 0.57–1.00)
GFR calc Af Amer: 100 mL/min/{1.73_m2} (ref >59)
GFR calc non Af Amer: 86 mL/min/{1.73_m2} (ref >59)
Globulin, Total: 2.8 g/dL (ref 1.5–4.5)
Glucose: 84 mg/dL (ref 65–99)
Potassium: 3.7 mmol/L (ref 3.5–5.2)
Sodium: 140 mmol/L (ref 134–144)
Total Protein: 7.2 g/dL (ref 6.0–8.5)

## 2019-11-01 LAB — CBC
Hematocrit: 40.6 % (ref 34.0–46.6)
Hemoglobin: 13.4 g/dL (ref 11.1–15.9)
MCH: 27.4 pg (ref 26.6–33.0)
MCHC: 33 g/dL (ref 31.5–35.7)
MCV: 83 fL (ref 79–97)
Platelets: 226 10*3/uL (ref 150–450)
RBC: 4.89 x10E6/uL (ref 3.77–5.28)
RDW: 13.3 % (ref 11.7–15.4)
WBC: 8.4 10*3/uL (ref 3.4–10.8)

## 2019-11-01 LAB — LIPID PANEL
Chol/HDL Ratio: 6.2 ratio — ABNORMAL HIGH (ref 0.0–4.4)
Cholesterol, Total: 237 mg/dL — ABNORMAL HIGH (ref 100–199)
HDL: 38 mg/dL — ABNORMAL LOW (ref >39)
LDL Chol Calc (NIH): 179 mg/dL — ABNORMAL HIGH (ref 0–99)
Triglycerides: 110 mg/dL (ref 0–149)
VLDL Cholesterol Cal: 20 mg/dL (ref 5–40)

## 2019-11-01 LAB — HEMOGLOBIN A1C
Est. average glucose Bld gHb Est-mCnc: 117 mg/dL
Hgb A1c MFr Bld: 5.7 % — ABNORMAL HIGH (ref 4.8–5.6)

## 2019-11-04 DIAGNOSIS — M79604 Pain in right leg: Secondary | ICD-10-CM | POA: Diagnosis not present

## 2019-11-06 DIAGNOSIS — M79604 Pain in right leg: Secondary | ICD-10-CM | POA: Diagnosis not present

## 2019-11-08 DIAGNOSIS — E2839 Other primary ovarian failure: Secondary | ICD-10-CM | POA: Insufficient documentation

## 2019-11-08 DIAGNOSIS — G4700 Insomnia, unspecified: Secondary | ICD-10-CM | POA: Insufficient documentation

## 2019-11-11 ENCOUNTER — Ambulatory Visit: Payer: BC Managed Care – PPO | Admitting: Nurse Practitioner

## 2019-11-13 DIAGNOSIS — M79604 Pain in right leg: Secondary | ICD-10-CM | POA: Diagnosis not present

## 2019-11-28 ENCOUNTER — Ambulatory Visit: Payer: BC Managed Care – PPO | Admitting: Nurse Practitioner

## 2019-11-28 DIAGNOSIS — M79604 Pain in right leg: Secondary | ICD-10-CM | POA: Diagnosis not present

## 2019-12-05 ENCOUNTER — Ambulatory Visit: Payer: BC Managed Care – PPO | Admitting: Nurse Practitioner

## 2020-01-02 ENCOUNTER — Ambulatory Visit (INDEPENDENT_AMBULATORY_CARE_PROVIDER_SITE_OTHER): Payer: BC Managed Care – PPO | Admitting: Nurse Practitioner

## 2020-01-02 ENCOUNTER — Encounter: Payer: Self-pay | Admitting: Nurse Practitioner

## 2020-01-02 VITALS — BP 122/76 | HR 79 | Temp 98.5°F | Ht 62.2 in | Wt 145.0 lb

## 2020-01-02 DIAGNOSIS — G4700 Insomnia, unspecified: Secondary | ICD-10-CM

## 2020-01-02 MED ORDER — HYDROCHLOROTHIAZIDE 25 MG PO TABS: 25.0000 mg | ORAL_TABLET | Freq: Every day | ORAL | 0 refills | Status: DC

## 2020-01-02 NOTE — Progress Notes (Signed)
I,Yamilka Roman [employer] as a Education administrator for [employer], [name].,have documented all relevant documentation on the behalf of [name], [name],as directed by  [name], [name] while in the presence of [name], [place]. This visit occurred during the SARS-CoV-2 public health emergency.  Safety protocols were in place, including screening questions prior to the visit, additional usage of staff PPE, and extensive cleaning of exam room while observing appropriate contact time as indicated for disinfecting solutions.  Subjective:     Patient ID: [name] , female    DOB: March 12, 1950 , 69 y.o.   MRN: [id number]   Chief Complaint  Patient presents with  . Insomnia    patient is here for a f/u on trazodone. patient stated she only used the medication one time. she stated she only uses it prn due to her neighbors downstairs.    HPI  She is here today for follow up sleep medication.  She was given trazadone in which she has only used once and was effective.  She is scheduled to have her 2nd covid vaccine November 28th.      Past Medical History:  Diagnosis Date  . Arthritis    shoulder - no meds  . Hyperlipidemia   . Hypertension   . SVD (spontaneous vaginal delivery)    x 5     Family History  Problem Relation Age of Onset  . Diabetes Mother      Current Outpatient Medications:  .  hydrochlorothiazide (HYDRODIURIL) 25 MG tablet, Take 1 tablet (25 mg total) by mouth daily., Disp: 90 tablet, Rfl: 0 .  traZODone (DESYREL) 50 MG tablet, Take 1 tablet (50 mg total) by mouth at bedtime as needed for sleep., Disp: 30 tablet, Rfl: 2 .  celecoxib (CELEBREX) 200 MG capsule, Take 1 capsule (200 mg total) by mouth 2 (two) times daily. (Patient not taking: Reported on 01/02/2020), Disp: 20 capsule, Rfl: 0   Allergies  Allergen Reactions  . Pollen Extract     Watery Eyes & Runny Nose     Review of Systems  Constitutional: Negative.   Respiratory: Negative.  Negative for cough.    Cardiovascular: Negative.  Negative for chest pain, palpitations and leg swelling.  Neurological: Negative.   Psychiatric/Behavioral: Positive for sleep disturbance.     Today's Vitals   01/02/20 1606  BP: 122/76  Pulse: 79  Temp: 98.5 F (36.9 C)  TempSrc: Oral  Weight: 145 lb (65.8 kg)  Height: 5' 2.2" (1.58 m)  PainSc: 0-No pain   Body mass index is 26.35 kg/m.   Objective:  Physical Exam Vitals reviewed.  Constitutional:      General: She is not in acute distress.    Appearance: Normal appearance.  Cardiovascular:     Rate and Rhythm: Normal rate and regular rhythm.     Pulses: Normal pulses.     Heart sounds: Normal heart sounds. No murmur heard.   Pulmonary:     Effort: Pulmonary effort is normal. No respiratory distress.     Breath sounds: Normal breath sounds.  Neurological:     General: No focal deficit present.     Mental Status: She is alert and oriented to person, place, and time.     Cranial Nerves: No cranial nerve deficit.  Psychiatric:        Mood and Affect: Mood normal.        Behavior: Behavior normal.        Thought Content: Thought content normal.  Judgment: Judgment normal.         Assessment And Plan:     1. Insomnia, unspecified type  She is tolerating medication well when she takes the medication  No changes at this time     Patient was given opportunity to ask questions. Patient verbalized understanding of the plan and was able to repeat key elements of the plan. All questions were answered to their satisfaction.    Teola Bradley, [name], have reviewed all documentation for this visit. The documentation on 01/03/20 for the exam, diagnosis, procedures, and orders are all accurate and complete.  THE PATIENT IS ENCOURAGED TO PRACTICE SOCIAL DISTANCING DUE TO THE COVID-19 PANDEMIC.

## 2020-03-27 ENCOUNTER — Other Ambulatory Visit: Payer: Self-pay

## 2020-03-27 ENCOUNTER — Emergency Department (HOSPITAL_COMMUNITY): Payer: No Typology Code available for payment source

## 2020-03-27 ENCOUNTER — Emergency Department (HOSPITAL_COMMUNITY)
Admission: EM | Admit: 2020-03-27 | Discharge: 2020-03-27 | Disposition: A | Discharge status: Home / Self Care | Payer: No Typology Code available for payment source | Attending: Emergency Medicine | Admitting: Emergency Medicine

## 2020-03-27 DIAGNOSIS — W07XXXA Fall from chair, initial encounter: Secondary | ICD-10-CM | POA: Insufficient documentation

## 2020-03-27 DIAGNOSIS — M545 Low back pain, unspecified: Secondary | ICD-10-CM | POA: Diagnosis not present

## 2020-03-27 DIAGNOSIS — Y990 Civilian activity done for income or pay: Secondary | ICD-10-CM | POA: Insufficient documentation

## 2020-03-27 DIAGNOSIS — I10 Essential (primary) hypertension: Secondary | ICD-10-CM | POA: Diagnosis not present

## 2020-03-27 DIAGNOSIS — Z79899 Other long term (current) drug therapy: Secondary | ICD-10-CM | POA: Diagnosis not present

## 2020-03-27 DIAGNOSIS — M79642 Pain in left hand: Secondary | ICD-10-CM | POA: Diagnosis not present

## 2020-03-27 DIAGNOSIS — W19XXXA Unspecified fall, initial encounter: Secondary | ICD-10-CM

## 2020-03-27 NOTE — ED Notes (Signed)
Patient transported to X-ray 

## 2020-03-27 NOTE — ED Triage Notes (Signed)
Pt presents to ED POV. Pt c/o lower back pain, bilateral forearm pain, bilateral hand pain, and pain in L thumb, and neck since tuesday. Pt reports she was sitting in chair at work and chair collapsed while she was sitting in it. Did hit head, denies LOC, denies blood thinners.

## 2020-03-27 NOTE — ED Provider Notes (Signed)
[facility] EMERGENCY DEPARTMENT Provider Note   CSN: [id number] Arrival date & time: 03/27/20  1552     History Chief Complaint  Patient presents with  . Fall    [name] is a 70 y.o. female.  HPI   Patient presents ED with complaints of pain after a fall.  Patient states she was at work when she was sitting in a chair neck collapsed.  She ended up falling.  This occurred on Tuesday.  Since then she has been having pain in her left hand and her lower back.  Patient denies hitting her head or any loss of consciousness.  She is not have any chest pain or shortness of breath.  No difficulty walking.  Past Medical History:  Diagnosis Date  . Arthritis    shoulder - no meds  . Hyperlipidemia   . Hypertension   . SVD (spontaneous vaginal delivery)    x 5    Patient Active Problem List   Diagnosis Date Noted  . Insomnia 11/08/2019  . Decreased estrogen level 11/08/2019  . Essential hypertension 03/29/2018  . Mixed hyperlipidemia 03/29/2018  . Prediabetes 03/29/2018  . Pelvic relaxation 04/01/2013    Past Surgical History:  Procedure Laterality Date  . COLONOSCOPY    . CYSTOCELE REPAIR N/A 04/01/2013   Procedure: ANTERIOR REPAIR (CYSTOCELE) with Enterocele Repair;  Surgeon: Lovenia Kim, MD;  Location: [facility];  Service: Gynecology;  Laterality: N/A;  90 min.  Marland Kitchen RECTOCELE REPAIR N/A 04/01/2013   Procedure: POSTERIOR REPAIR (RECTOCELE);  Surgeon: Lovenia Kim, MD;  Location: [facility];  Service: Gynecology;  Laterality: N/A;  . TUBAL LIGATION    . VAGINAL HYSTERECTOMY N/A 04/01/2013   Procedure: HYSTERECTOMY TOTAL VAGINAL;  Surgeon: Lovenia Kim, MD;  Location: [facility];  Service: Gynecology;  Laterality: N/A;  . WISDOM TOOTH EXTRACTION       OB History   No obstetric history on file.     Family History  Problem Relation Age of Onset  . Diabetes Mother     Social History   Tobacco Use  . Smoking status: Never Smoker  . Smokeless  tobacco: Never Used  Substance Use Topics  . Alcohol use: Yes    Comment: socially  . Drug use: No    Home Medications Prior to Admission medications   Medication Sig Start Date End Date Taking? Authorizing Provider  celecoxib (CELEBREX) 200 MG capsule Take 1 capsule (200 mg total) by mouth 2 (two) times daily. Patient not taking: Reported on 01/02/2020 08/14/19   Margarita Mail, PA-C  hydrochlorothiazide (HYDRODIURIL) 25 MG tablet Take 1 tablet (25 mg total) by mouth daily. 01/02/20   [name], [name]  traZODone (DESYREL) 50 MG tablet Take 1 tablet (50 mg total) by mouth at bedtime as needed for sleep. 10/31/19   [name], [name]    Allergies    Pollen extract  Review of Systems   Review of Systems  All other systems reviewed and are negative.   Physical Exam Updated Vital Signs BP (!) 148/80 (BP Location: Right Arm)   Pulse 60   Temp 98 F (36.7 C) (Oral)   Resp 15   SpO2 100%   Physical Exam Vitals and nursing note reviewed.  Constitutional:      General: She is not in acute distress.    Appearance: She is well-developed.  HENT:     Head: Normocephalic and atraumatic.     Right Ear: External ear normal.  Left Ear: External ear normal.  Eyes:     General: No scleral icterus.       Right eye: No discharge.        Left eye: No discharge.     Conjunctiva/sclera: Conjunctivae normal.  Neck:     Trachea: No tracheal deviation.  Cardiovascular:     Rate and Rhythm: Normal rate.  Pulmonary:     Effort: Pulmonary effort is normal. No respiratory distress.     Breath sounds: No stridor.  Abdominal:     General: There is no distension.  Musculoskeletal:        General: Tenderness present. No swelling or deformity.     Cervical back: Neck supple.     Comments: No cervical spine tenderness, no thoracic spine tenderness, tenderness palpation lumbar spine mild tenderness left hand and right hand, no deformity, no bruising, no tenderness elsewhere in her upper  extremities or lower extremities  Skin:    General: Skin is warm and dry.     Findings: No rash.  Neurological:     Mental Status: She is alert.     Cranial Nerves: Cranial nerve deficit: no gross deficits.     ED Results / Procedures / Treatments   Labs (all labs ordered are listed, but only abnormal results are displayed) Labs Reviewed - No data to display  EKG None  Radiology DG Lumbar Spine Complete  Result Date: 03/27/2020 CLINICAL DATA:  Status post fall. EXAM: LUMBAR SPINE - COMPLETE 4+ VIEW COMPARISON:  August 14, 2019 FINDINGS: There is no evidence of lumbar spine fracture. Mild dextroscoliosis is seen. Bilateral facet joint hypertrophy is seen at the levels of L4-L5 and L5-S1. Intervertebral disc spaces are maintained. IMPRESSION: Mild dextroscoliosis with bilateral facet joint hypertrophy at L4-L5 and L5-S1. Electronically Signed   By: Virgina Norfolk M.D.   On: 03/27/2020 22:36   CT Head Wo Contrast  Result Date: 03/27/2020 CLINICAL DATA:  [place] from a chair. EXAM: CT HEAD WITHOUT CONTRAST CT CERVICAL SPINE WITHOUT CONTRAST TECHNIQUE: Multidetector CT imaging of the head and cervical spine was performed following the standard protocol without intravenous contrast. Multiplanar CT image reconstructions of the cervical spine were also generated. COMPARISON:  None. FINDINGS: CT HEAD FINDINGS Brain: The ventricles are normal in size and configuration. No extra-axial fluid collections are identified. The gray-white differentiation is maintained. No CT findings for acute hemispheric infarction or intracranial hemorrhage. No mass lesions. The brainstem and cerebellum are normal. Vascular: No hyperdense vessels or obvious aneurysm. Skull: No acute skull fracture. No bone lesion. Hyperostosis frontalis interna noted. Sinuses/Orbits: The paranasal sinuses and mastoid air cells are clear. The globes are intact. Other: No scalp lesions, laceration or hematoma. CT CERVICAL SPINE FINDINGS  Alignment: Normal Skull base and vertebrae: No acute fracture. No primary bone lesion or focal pathologic process. Soft tissues and spinal canal: No prevertebral fluid or swelling. No visible canal hematoma. Disc levels: Age advanced degenerative cervical spondylosis with multilevel disc disease and facet disease. Spinal canal is fairly generous. No large disc protrusions, significant spinal or foraminal stenosis. Upper chest: No significant findings. Other: No neck mass or adenopathy or hematoma. IMPRESSION: 1. No acute intracranial findings or skull fracture. 2. Age advanced degenerative cervical spondylosis with multilevel disc disease and facet disease but no acute cervical spine fracture. Electronically Signed   By: Marijo Sanes M.D.   On: 03/27/2020 18:18   CT Cervical Spine Wo Contrast  Result Date: 03/27/2020 CLINICAL DATA:  [place] from a chair.  EXAM: CT HEAD WITHOUT CONTRAST CT CERVICAL SPINE WITHOUT CONTRAST TECHNIQUE: Multidetector CT imaging of the head and cervical spine was performed following the standard protocol without intravenous contrast. Multiplanar CT image reconstructions of the cervical spine were also generated. COMPARISON:  None. FINDINGS: CT HEAD FINDINGS Brain: The ventricles are normal in size and configuration. No extra-axial fluid collections are identified. The gray-white differentiation is maintained. No CT findings for acute hemispheric infarction or intracranial hemorrhage. No mass lesions. The brainstem and cerebellum are normal. Vascular: No hyperdense vessels or obvious aneurysm. Skull: No acute skull fracture. No bone lesion. Hyperostosis frontalis interna noted. Sinuses/Orbits: The paranasal sinuses and mastoid air cells are clear. The globes are intact. Other: No scalp lesions, laceration or hematoma. CT CERVICAL SPINE FINDINGS Alignment: Normal Skull base and vertebrae: No acute fracture. No primary bone lesion or focal pathologic process. Soft tissues and spinal canal:  No prevertebral fluid or swelling. No visible canal hematoma. Disc levels: Age advanced degenerative cervical spondylosis with multilevel disc disease and facet disease. Spinal canal is fairly generous. No large disc protrusions, significant spinal or foraminal stenosis. Upper chest: No significant findings. Other: No neck mass or adenopathy or hematoma. IMPRESSION: 1. No acute intracranial findings or skull fracture. 2. Age advanced degenerative cervical spondylosis with multilevel disc disease and facet disease but no acute cervical spine fracture. Electronically Signed   By: Marijo Sanes M.D.   On: 03/27/2020 18:18   DG Hand 2 View Left  Result Date: 03/27/2020 CLINICAL DATA:  Pt tripped and landed on hands today - having left hand pain around the base of the left thumb up into lateral wrist area EXAM: LEFT HAND - 2 VIEW COMPARISON:  None. FINDINGS: No fracture or bone lesion. Joints are normally aligned. There is narrowing of the trapezium first metacarpal articulation with mild subchondral sclerosis and small osteophytes consistent with osteoarthritis. Minor osteoarthritis is noted involving several interphalangeal joints. Remaining joints normally spaced and aligned. Soft tissues are unremarkable. IMPRESSION: No fracture or dislocation. Electronically Signed   By: Lajean Manes M.D.   On: 03/27/2020 16:37   DG Hand Complete Right  Result Date: 03/27/2020 CLINICAL DATA:  Status post fall. EXAM: RIGHT HAND - COMPLETE 3+ VIEW COMPARISON:  None. FINDINGS: There is no evidence of fracture or dislocation. Mild degenerative changes seen along the carpometacarpal articulation of the right thumb. Soft tissues are unremarkable. IMPRESSION: No acute osseous abnormality. Electronically Signed   By: Virgina Norfolk M.D.   On: 03/27/2020 22:34    Procedures Procedures   Medications Ordered in ED Medications - No data to display  ED Course  I have reviewed the triage vital signs and the nursing  notes.  Pertinent labs & imaging results that were available during my care of the patient were reviewed by me and considered in my medical decision making (see chart for details).  Clinical Course as of 03/27/20 2340  Fri Mar 27, 2020  2143 Xrays head, c spine and hand negative [JK]    Clinical Course User Index [JK] Dorie Rank, MD   MDM Rules/Calculators/A&P                          Imaging tests were ordered at triage.  Patient had a head CT and a C-spine CT.  Both are unremarkable.  Hand x-ray does not show any acute findings.  Consistent with strain, contusion Final Clinical Impression(s) / ED Diagnoses Final diagnoses:  Fall, initial encounter  Rx / DC Orders ED Discharge Orders    None       Dorie Rank, MD 03/27/20 2340

## 2020-03-27 NOTE — Discharge Instructions (Addendum)
Take over-the-counter medications as needed for pain.  The x-rays did not show any signs of fracture or dislocation.  Follow-up with your doctor if not improving in a week

## 2020-03-27 NOTE — ED Notes (Signed)
E-signature pad unavailable at time of pt discharge. This RN discussed discharge materials with pt and answered all pt questions. Pt stated understanding of discharge material. ? ?

## 2020-04-23 ENCOUNTER — Other Ambulatory Visit: Payer: Self-pay

## 2020-04-23 MED ORDER — HYDROCHLOROTHIAZIDE 25 MG PO TABS: 25.0000 mg | ORAL_TABLET | Freq: Every day | ORAL | 0 refills | Status: DC

## 2020-04-30 ENCOUNTER — Ambulatory Visit: Payer: BC Managed Care – PPO | Admitting: Nurse Practitioner

## 2020-05-21 ENCOUNTER — Encounter: Payer: Self-pay | Admitting: Nurse Practitioner

## 2020-05-21 ENCOUNTER — Other Ambulatory Visit: Payer: Self-pay

## 2020-05-21 ENCOUNTER — Ambulatory Visit (INDEPENDENT_AMBULATORY_CARE_PROVIDER_SITE_OTHER): Payer: Medicare HMO | Admitting: Nurse Practitioner

## 2020-05-21 VITALS — BP 118/64 | HR 80 | Temp 98.6°F | Ht 60.6 in | Wt 149.0 lb

## 2020-05-21 DIAGNOSIS — E782 Mixed hyperlipidemia: Secondary | ICD-10-CM | POA: Diagnosis not present

## 2020-05-21 DIAGNOSIS — R7303 Prediabetes: Secondary | ICD-10-CM

## 2020-05-21 DIAGNOSIS — I10 Essential (primary) hypertension: Secondary | ICD-10-CM | POA: Diagnosis not present

## 2020-05-21 NOTE — Patient Instructions (Signed)

## 2020-05-21 NOTE — Progress Notes (Signed)
I,Yamilka Roman [employer] as a Education administrator for [employer], [name].,have documented all relevant documentation on the behalf of [name], [name],as directed by  [name], [name] while in the presence of [name], [place]. This visit occurred during the SARS-CoV-2 public health emergency.  Safety protocols were in place, including screening questions prior to the visit, additional usage of staff PPE, and extensive cleaning of exam room while observing appropriate contact time as indicated for disinfecting solutions.  Subjective:     Patient ID: [name] , female    DOB: 12-Feb-1951 , 70 y.o.   MRN: [id number]   Chief Complaint  Patient presents with  . Hypertension  . Prediabetes    HPI  Patient presents today for a blood pressure, abnormal glucose and cholesterol f/u   Wt Readings from Last 3 Encounters: 05/21/20 : 149 lb (67.6 kg) 01/02/20 : 145 lb (65.8 kg) 10/31/19 : 142 lb 9.6 oz (64.7 kg)  She is back dancing.     Hypertension This is a chronic problem. The current episode started more than 1 year ago. The problem is controlled. Pertinent negatives include no anxiety, chest pain, headaches or palpitations. There are no associated agents to hypertension. Risk factors for coronary artery disease include sedentary lifestyle and obesity. Past treatments include diuretics. There are no compliance problems.  There is no history of angina. There is no history of chronic renal disease.     Past Medical History:  Diagnosis Date  . Arthritis    shoulder - no meds  . Hyperlipidemia   . Hypertension   . SVD (spontaneous vaginal delivery)    x 5     Family History  Problem Relation Age of Onset  . Diabetes Mother      Current Outpatient Medications:  .  hydrochlorothiazide (HYDRODIURIL) 25 MG tablet, Take 1 tablet (25 mg total) by mouth daily., Disp: 90 tablet, Rfl: 0 .  celecoxib (CELEBREX) 200 MG capsule, Take 1 capsule (200 mg total) by mouth 2 (two) times daily.  (Patient not taking: No sig reported), Disp: 20 capsule, Rfl: 0 .  traZODone (DESYREL) 50 MG tablet, Take 1 tablet (50 mg total) by mouth at bedtime as needed for sleep. (Patient not taking: Reported on 05/21/2020), Disp: 30 tablet, Rfl: 2   Allergies  Allergen Reactions  . Pollen Extract     Watery Eyes & Runny Nose     Review of Systems  Constitutional: Negative.   Respiratory: Negative.  Negative for cough.   Cardiovascular: Negative.  Negative for chest pain, palpitations and leg swelling.  Neurological: Negative.  Negative for headaches.  Psychiatric/Behavioral: Positive for sleep disturbance.     Today's Vitals   05/21/20 1538  BP: 118/64  Pulse: 80  Temp: 98.6 F (37 C)  TempSrc: Oral  Weight: 149 lb (67.6 kg)  Height: 5' 0.6" (1.539 m)  PainSc: 0-No pain   Body mass index is 28.53 kg/m.   Objective:  Physical Exam Constitutional:      General: She is not in acute distress.    Appearance: Normal appearance.  Cardiovascular:     Rate and Rhythm: Normal rate and regular rhythm.     Pulses: Normal pulses.     Heart sounds: Normal heart sounds. No murmur heard.   Pulmonary:     Effort: Pulmonary effort is normal. No respiratory distress.     Breath sounds: Normal breath sounds. No wheezing.  Neurological:     General: No focal deficit present.  Mental Status: She is alert and oriented to person, place, and time.     Cranial Nerves: No cranial nerve deficit.     Motor: No weakness.  Psychiatric:        Mood and Affect: Mood normal.        Behavior: Behavior normal.        Thought Content: Thought content normal.        Judgment: Judgment normal.         Assessment And Plan:     1. Essential hypertension  Chronic, blood pressure is well controlled  Continue current regimen - CMP14+EGFR  2. Mixed hyperlipidemia Chronic, stable A low fat, low cholesterol is discussed with the patient. - Lipid panel  3. Prediabetes  Chronic, controlled  No  current medications  Encouraged to limit intake of sugary foods and drinks  Encouraged to continue with physical activity - Hemoglobin A1c     Patient was given opportunity to ask questions. Patient verbalized understanding of the plan and was able to repeat key elements of the plan. All questions were answered to their satisfaction.  [name], [name]   I, [name], [name], have reviewed all documentation for this visit. The documentation on 05/21/20 for the exam, diagnosis, procedures, and orders are all accurate and complete.   IF YOU HAVE BEEN REFERRED TO A SPECIALIST, IT MAY TAKE 1-2 WEEKS TO SCHEDULE/PROCESS THE REFERRAL. IF YOU HAVE NOT HEARD FROM US/SPECIALIST IN TWO WEEKS, PLEASE GIVE [place] A CALL AT [phone].   THE PATIENT IS ENCOURAGED TO PRACTICE SOCIAL DISTANCING DUE TO THE COVID-19 PANDEMIC.

## 2020-05-22 LAB — CMP14+EGFR
ALT: 11 IU/L (ref 0–32)
AST: 16 IU/L (ref 0–40)
Albumin/Globulin Ratio: 1.9 (ref 1.2–2.2)
Albumin: 4.6 g/dL (ref 3.8–4.8)
Alkaline Phosphatase: 82 IU/L (ref 44–121)
BUN/Creatinine Ratio: 15 (ref 12–28)
BUN: 13 mg/dL (ref 8–27)
Bilirubin Total: 0.4 mg/dL (ref 0.0–1.2)
CO2: 25 mmol/L (ref 20–29)
Calcium: 9.6 mg/dL (ref 8.7–10.3)
Chloride: 100 mmol/L (ref 96–106)
Creatinine, Ser: 0.86 mg/dL (ref 0.57–1.00)
Globulin, Total: 2.4 g/dL (ref 1.5–4.5)
Glucose: 103 mg/dL — ABNORMAL HIGH (ref 65–99)
Potassium: 3.8 mmol/L (ref 3.5–5.2)
Sodium: 141 mmol/L (ref 134–144)
Total Protein: 7 g/dL (ref 6.0–8.5)
eGFR: 73 mL/min/{1.73_m2} (ref >59)

## 2020-05-22 LAB — HEMOGLOBIN A1C
Est. average glucose Bld gHb Est-mCnc: 103 mg/dL
Hgb A1c MFr Bld: 5.2 % (ref 4.8–5.6)

## 2020-05-22 LAB — LIPID PANEL
Chol/HDL Ratio: 6.5 ratio — ABNORMAL HIGH (ref 0.0–4.4)
Cholesterol, Total: 228 mg/dL — ABNORMAL HIGH (ref 100–199)
HDL: 35 mg/dL — ABNORMAL LOW (ref >39)
LDL Chol Calc (NIH): 167 mg/dL — ABNORMAL HIGH (ref 0–99)
Triglycerides: 143 mg/dL (ref 0–149)
VLDL Cholesterol Cal: 26 mg/dL (ref 5–40)

## 2020-07-01 ENCOUNTER — Telehealth: Payer: Self-pay | Admitting: Nurse Practitioner

## 2020-07-01 NOTE — Telephone Encounter (Signed)
pt is calling in stating that Shirlean Mylar called her and wanted her to call the office but she is not a pt at this office [facility]) she is a pt at [facility] w/(Dr. Laurance Flatten).

## 2020-07-01 NOTE — Telephone Encounter (Signed)
Left message for patient to call back and schedule Medicare Annual Wellness Visit (AWV) either virtually or in office.   AWVI 03/17/17 per [facility]   please schedule at anytime with [facility]    This should be a 45 minute visit.

## 2020-07-02 ENCOUNTER — Ambulatory Visit (INDEPENDENT_AMBULATORY_CARE_PROVIDER_SITE_OTHER): Payer: Medicare HMO

## 2020-07-02 VITALS — Ht 63.0 in | Wt 150.0 lb

## 2020-07-02 DIAGNOSIS — Z0000 Encounter for general adult medical examination without abnormal findings: Secondary | ICD-10-CM

## 2020-07-02 NOTE — Patient Instructions (Signed)
[name] , Thank you for taking time to come for your Medicare Wellness Visit. I appreciate your ongoing commitment to your health goals. Please review the following plan we discussed and let me know if I can assist you in the future.   Screening recommendations/referrals: Colonoscopy: patient states she had Mammogram: patient to schedule Bone Density: patient to schedule Recommended yearly ophthalmology/optometry visit for glaucoma screening and checkup Recommended yearly dental visit for hygiene and checkup  Vaccinations: Influenza vaccine: decline Pneumococcal vaccine: decline Tdap vaccine:  Completed 10/25/2018, due 10/24/2028 Shingles vaccine: discussed   Covid-19: 08/18/2019, 01/12/2020  Advanced directives: Advance directive discussed with you today.   Conditions/risks identified: none  Next appointment: Follow up in one year for your annual wellness visit    Preventive Care 65 Years and Older, Female Preventive care refers to lifestyle choices and visits with your health care provider that can promote health and wellness. What does preventive care include?  A yearly physical exam. This is also called an annual well check.  Dental exams once or twice a year.  Routine eye exams. Ask your health care provider how often you should have your eyes checked.  Personal lifestyle choices, including:  Daily care of your teeth and gums.  Regular physical activity.  Eating a healthy diet.  Avoiding tobacco and drug use.  Limiting alcohol use.  Practicing safe sex.  Taking low-dose aspirin every day.  Taking vitamin and mineral supplements as recommended by your health care provider. What happens during an annual well check? The services and screenings done by your health care provider during your annual well check will depend on your age, overall health, lifestyle risk factors, and family history of disease. Counseling  Your health care provider may ask you questions  about your:  Alcohol use.  Tobacco use.  Drug use.  Emotional well-being.  Home and relationship well-being.  Sexual activity.  Eating habits.  History of falls.  Memory and ability to understand (cognition).  Work and work Statistician.  Reproductive health. Screening  You may have the following tests or measurements:  Height, weight, and BMI.  Blood pressure.  Lipid and cholesterol levels. These may be checked every 5 years, or more frequently if you are over 71 years old.  Skin check.  Lung cancer screening. You may have this screening every year starting at age 1 if you have a 30-pack-year history of smoking and currently smoke or have quit within the past 15 years.  Fecal occult blood test (FOBT) of the stool. You may have this test every year starting at age 84.  Flexible sigmoidoscopy or colonoscopy. You may have a sigmoidoscopy every 5 years or a colonoscopy every 10 years starting at age 61.  Hepatitis C blood test.  Hepatitis B blood test.  Sexually transmitted disease (STD) testing.  Diabetes screening. This is done by checking your blood sugar (glucose) after you have not eaten for a while (fasting). You may have this done every 1-3 years.  Bone density scan. This is done to screen for osteoporosis. You may have this done starting at age 57.  Mammogram. This may be done every 1-2 years. Talk to your health care provider about how often you should have regular mammograms. Talk with your health care provider about your test results, treatment options, and if necessary, the need for more tests. Vaccines  Your health care provider may recommend certain vaccines, such as:  Influenza vaccine. This is recommended every year.  Tetanus, diphtheria, and acellular  pertussis (Tdap, Td) vaccine. You may need a Td booster every 10 years.  Zoster vaccine. You may need this after age 31.  Pneumococcal 13-valent conjugate (PCV13) vaccine. One dose is  recommended after age 86.  Pneumococcal polysaccharide (PPSV23) vaccine. One dose is recommended after age 41. Talk to your health care provider about which screenings and vaccines you need and how often you need them. This information is not intended to replace advice given to you by your health care provider. Make sure you discuss any questions you have with your health care provider. Document Released: 02/27/2015 Document Revised: 10/21/2015 Document Reviewed: 12/02/2014 Elsevier Interactive Patient Education  2017 [place] Prevention in the Home Falls can cause injuries. They can happen to people of all ages. There are many things you can do to make your home safe and to help prevent falls. What can I do on the outside of my home?  Regularly fix the edges of walkways and driveways and fix any cracks.  Remove anything that might make you trip as you walk through a door, such as a raised step or threshold.  Trim any bushes or trees on the path to your home.  Use bright outdoor lighting.  Clear any walking paths of anything that might make someone trip, such as rocks or tools.  Regularly check to see if handrails are loose or broken. Make sure that both sides of any steps have handrails.  Any raised decks and porches should have guardrails on the edges.  Have any leaves, snow, or ice cleared regularly.  Use sand or salt on walking paths during winter.  Clean up any spills in your garage right away. This includes oil or grease spills. What can I do in the bathroom?  Use night lights.  Install grab bars by the toilet and in the tub and shower. Do not use towel bars as grab bars.  Use non-skid mats or decals in the tub or shower.  If you need to sit down in the shower, use a plastic, non-slip stool.  Keep the floor dry. Clean up any water that spills on the floor as soon as it happens.  Remove soap buildup in the tub or shower regularly.  Attach bath mats  securely with double-sided non-slip rug tape.  Do not have throw rugs and other things on the floor that can make you trip. What can I do in the bedroom?  Use night lights.  Make sure that you have a light by your bed that is easy to reach.  Do not use any sheets or blankets that are too big for your bed. They should not hang down onto the floor.  Have a firm chair that has side arms. You can use this for support while you get dressed.  Do not have throw rugs and other things on the floor that can make you trip. What can I do in the kitchen?  Clean up any spills right away.  Avoid walking on wet floors.  Keep items that you use a lot in easy-to-reach places.  If you need to reach something above you, use a strong step stool that has a grab bar.  Keep electrical cords out of the way.  Do not use floor polish or wax that makes floors slippery. If you must use wax, use non-skid floor wax.  Do not have throw rugs and other things on the floor that can make you trip. What can I do with my stairs?  Do not leave any items on the stairs.  Make sure that there are handrails on both sides of the stairs and use them. Fix handrails that are broken or loose. Make sure that handrails are as long as the stairways.  Check any carpeting to make sure that it is firmly attached to the stairs. Fix any carpet that is loose or worn.  Avoid having throw rugs at the top or bottom of the stairs. If you do have throw rugs, attach them to the floor with carpet tape.  Make sure that you have a light switch at the top of the stairs and the bottom of the stairs. If you do not have them, ask someone to add them for you. What else can I do to help prevent falls?  Wear shoes that:  Do not have high heels.  Have rubber bottoms.  Are comfortable and fit you well.  Are closed at the toe. Do not wear sandals.  If you use a stepladder:  Make sure that it is fully opened. Do not climb a closed  stepladder.  Make sure that both sides of the stepladder are locked into place.  Ask someone to hold it for you, if possible.  Clearly mark and make sure that you can see:  Any grab bars or handrails.  First and last steps.  Where the edge of each step is.  Use tools that help you move around (mobility aids) if they are needed. These include:  Canes.  Walkers.  Scooters.  Crutches.  Turn on the lights when you go into a dark area. Replace any light bulbs as soon as they burn out.  Set up your furniture so you have a clear path. Avoid moving your furniture around.  If any of your floors are uneven, fix them.  If there are any pets around you, be aware of where they are.  Review your medicines with your doctor. Some medicines can make you feel dizzy. This can increase your chance of falling. Ask your doctor what other things that you can do to help prevent falls. This information is not intended to replace advice given to you by your health care provider. Make sure you discuss any questions you have with your health care provider. Document Released: 11/27/2008 Document Revised: 07/09/2015 Document Reviewed: 03/07/2014 Elsevier Interactive Patient Education  2017 [employer].

## 2020-07-02 NOTE — Progress Notes (Signed)
I connected with [name] today by telephone and verified that I am speaking with the correct person using two identifiers. Location patient: home Location provider: work Persons participating in the virtual visit: [name], [name].   I discussed the limitations, risks, security and privacy concerns of performing an evaluation and management service by telephone and the availability of in person appointments. I also discussed with the patient that there may be a patient responsible charge related to this service. The patient expressed understanding and verbally consented to this telephonic visit.    Interactive audio and video telecommunications were attempted between this provider and patient, however failed, due to patient having technical difficulties OR patient did not have access to video capability.  We continued and completed visit with audio only.     Vital signs may be patient reported or missing.  Subjective:   [name] is a 70 y.o. female who presents for an Initial Medicare Annual Wellness Visit.  Review of Systems     Cardiac Risk Factors include: advanced age (>49men, >72 women);dyslipidemia;hypertension;sedentary lifestyle     Objective:    Today's Vitals   07/02/20 1356  Weight: 150 lb (68 kg)  Height: 5\' 3"  (1.6 m)   Body mass index is 26.57 kg/m.  Advanced Directives 07/02/2020 08/14/2019 02/17/2016 10/31/2013 04/01/2013 03/27/2013  Does Patient Have a Medical Advance Directive? No No No No Patient has advance directive, copy not in chart Patient does not have advance directive  Type of Advance Directive - - - - Living will -  Does patient want to make changes to medical advance directive? - - - - No change requested -  Copy of [place] in Chart? - - - - Copy requested from family -  Would patient like information on creating a medical advance directive? - Yes (ED - Information included in AVS) No - Patient  declined - - -  Pre-existing out of facility DNR order (yellow form or pink MOST form) - - - - No -    Current Medications (verified) Outpatient Encounter Medications as of 07/02/2020  Medication Sig  . hydrochlorothiazide (HYDRODIURIL) 25 MG tablet Take 1 tablet (25 mg total) by mouth daily.  . celecoxib (CELEBREX) 200 MG capsule Take 1 capsule (200 mg total) by mouth 2 (two) times daily. (Patient not taking: No sig reported)  . traZODone (DESYREL) 50 MG tablet Take 1 tablet (50 mg total) by mouth at bedtime as needed for sleep. (Patient not taking: No sig reported)   No facility-administered encounter medications on file as of 07/02/2020.    Allergies (verified) Pollen extract   History: Past Medical History:  Diagnosis Date  . Arthritis    shoulder - no meds  . Hyperlipidemia   . Hypertension   . SVD (spontaneous vaginal delivery)    x 5   Past Surgical History:  Procedure Laterality Date  . COLONOSCOPY    . CYSTOCELE REPAIR N/A 04/01/2013   Procedure: ANTERIOR REPAIR (CYSTOCELE) with Enterocele Repair;  Surgeon: Lovenia Kim, MD;  Location: [facility];  Service: Gynecology;  Laterality: N/A;  90 min.  Marland Kitchen RECTOCELE REPAIR N/A 04/01/2013   Procedure: POSTERIOR REPAIR (RECTOCELE);  Surgeon: Lovenia Kim, MD;  Location: [facility];  Service: Gynecology;  Laterality: N/A;  . TUBAL LIGATION    . VAGINAL HYSTERECTOMY N/A 04/01/2013   Procedure: HYSTERECTOMY TOTAL VAGINAL;  Surgeon: Lovenia Kim, MD;  Location: [facility];  Service: Gynecology;  Laterality: N/A;  .  WISDOM TOOTH EXTRACTION     Family History  Problem Relation Age of Onset  . Diabetes Mother    Social History   Socioeconomic History  . Marital status: Divorced    Spouse name: Not on file  . Number of children: Not on file  . Years of education: Not on file  . Highest education level: Not on file  Occupational History  . Not on file  Tobacco Use  . Smoking status: Never Smoker  . Smokeless tobacco: Never  Used  Vaping Use  . Vaping Use: Never used  Substance and Sexual Activity  . Alcohol use: Yes    Comment: socially  . Drug use: No  . Sexual activity: Yes    Birth control/protection: Surgical  Other Topics Concern  . Not on file  Social History Narrative  . Not on file   Social Determinants of Health   Financial Resource Strain: Low Risk   . Difficulty of Paying Living Expenses: Not hard at all  Food Insecurity: No Food Insecurity  . Worried About Charity fundraiser in the Last Year: Never true  . Ran Out of Food in the Last Year: Never true  Transportation Needs: No Transportation Needs  . Lack of Transportation (Medical): No  . Lack of Transportation (Non-Medical): No  Physical Activity: Insufficiently Active  . Days of Exercise per Week: 3 days  . Minutes of Exercise per Session: 30 min  Stress: No Stress Concern Present  . Feeling of Stress : Not at all  Social Connections: Not on file    Tobacco Counseling Counseling given: Not Answered   Clinical Intake:  Pre-visit preparation completed: Yes  Pain : No/denies pain     Nutritional Status: BMI 25 -29 Overweight Nutritional Risks: None Diabetes: No  How often do you need to have someone help you when you read instructions, pamphlets, or other written materials from your doctor or pharmacy?: 1 - Never What is the last grade level you completed in school?: 12th grade  Diabetic? no  Interpreter Needed?: No  Information entered by :: NAllen Dean   Activities of Daily Living In your present state of health, do you have any difficulty performing the following activities: 07/02/2020 05/21/2020  Hearing? N N  Vision? N N  Difficulty concentrating or making decisions? N N  Walking or climbing stairs? N N  Dressing or bathing? N N  Doing errands, shopping? N N  Preparing Food and eating ? N -  Using the Toilet? N -  In the past six months, have you accidently leaked urine? N -  Do you have problems with  loss of bowel control? N -  Managing your Medications? N -  Managing your Finances? N -  Housekeeping or managing your Housekeeping? N -  Some recent data might be hidden    Patient Care Team: [name], [name] as PCP - General (General Practice)  Indicate any recent Medical Services you may have received from other than [facility] providers in the past year (date may be approximate).     Assessment:   This is a routine wellness examination for [name].  Hearing/Vision screen  Hearing Screening   125Hz  250Hz  500Hz  1000Hz  2000Hz  3000Hz  4000Hz  6000Hz  8000Hz   Right ear:           Left ear:           Vision Screening Comments: Regular eye exams, [facility]  Dietary issues and exercise activities discussed: Current Exercise Habits: The patient does  not participate in regular exercise at present  Goals Addressed            This Visit's Progress   . Patient Stated       07/02/2020, reducing debt      Depression Screen PHQ 2/9 Scores 07/02/2020 10/31/2019 10/25/2018 03/29/2018  PHQ - 2 Score 0 0 0 0    Fall Risk Fall Risk  07/02/2020 10/31/2019 10/25/2018 03/29/2018  Falls in the past year? 1 0 0 0  Comment fell trying to position a chair - - -  Number falls in past yr: 0 - - -  Injury with Fall? 0 - - -  Risk for fall due to : Medication side effect - - -  Follow up Falls evaluation completed;Education provided;Falls prevention discussed - - -    FALL RISK PREVENTION PERTAINING TO THE HOME:  Any stairs in or around the home? No  If so, are there any without handrails? n/a Home free of loose throw rugs in walkways, pet beds, electrical cords, etc? Yes  Adequate lighting in your home to reduce risk of falls? Yes   ASSISTIVE DEVICES UTILIZED TO PREVENT FALLS:  Life alert? No  Use of a cane, walker or w/c? No  Grab bars in the bathroom? Yes  Shower chair or bench in shower? No  Elevated toilet seat or a handicapped toilet? Yes   TIMED UP AND GO:  Was the test performed? No  .  Cognitive Function:     6CIT Screen 07/02/2020  What Year? 0 points  What month? 0 points  What time? 0 points  Count back from 20 0 points  Months in reverse 0 points  Repeat phrase 0 points  Total Score 0    Immunizations Immunization History  Administered Date(s) Administered  . PFIZER(Purple Top)SARS-COV-2 Vaccination 08/18/2019, 01/12/2020  . Pneumococcal Polysaccharide-23 09/28/2017  . Tdap 12/25/2008, 10/25/2018    TDAP status: Up to date  Flu Vaccine status: Declined, Education has been provided regarding the importance of this vaccine but patient still declined. Advised may receive this vaccine at local pharmacy or Health Dept. Aware to provide a copy of the vaccination record if obtained from local pharmacy or Health Dept. Verbalized acceptance and understanding.  Pneumococcal vaccine status: Declined,  Education has been provided regarding the importance of this vaccine but patient still declined. Advised may receive this vaccine at local pharmacy or Health Dept. Aware to provide a copy of the vaccination record if obtained from local pharmacy or Health Dept. Verbalized acceptance and understanding.   Covid-19 vaccine status: Completed vaccines  Qualifies for Shingles Vaccine? Yes   Zostavax completed No   Shingrix Completed?: No.    Education has been provided regarding the importance of this vaccine. Patient has been advised to call insurance company to determine out of pocket expense if they have not yet received this vaccine. Advised may also receive vaccine at local pharmacy or Health Dept. Verbalized acceptance and understanding.  Screening Tests Health Maintenance  Topic Date Due  . COLONOSCOPY (Pts 45-45yrs Insurance coverage will need to be confirmed)  Never done  . DEXA SCAN  Never done  . MAMMOGRAM  10/07/2017  . COVID-19 Vaccine (3 - Booster for Pfizer series) 06/11/2020  . PNA vac Low Risk Adult (2 of 2 - PCV13) 07/02/2021 (Originally 09/29/2018)  .  INFLUENZA VACCINE  09/14/2020  . TETANUS/TDAP  10/24/2028  . Hepatitis C Screening  Completed  . HPV Delta  Maintenance  Health Maintenance Due  Topic Date Due  . COLONOSCOPY (Pts 45-25yrs Insurance coverage will need to be confirmed)  Never done  . DEXA SCAN  Never done  . MAMMOGRAM  10/07/2017  . COVID-19 Vaccine (3 - Booster for Pfizer series) 06/11/2020    Colorectal cancer screening: patient states had  Mammogram status: patient to schedule  Bone Density status: patient to schedule  Lung Cancer Screening: (Low Dose CT Chest recommended if Age 12-80 years, 30 pack-year currently smoking OR have quit w/in 15years.) does not qualify.   Lung Cancer Screening Referral: no  Additional Screening:  Hepatitis C Screening: does qualify; Completed 10/25/2018  Vision Screening: Recommended annual ophthalmology exams for early detection of glaucoma and other disorders of the eye. Is the patient up to date with their annual eye exam?  Yes  Who is the provider or what is the name of the office in which the patient attends annual eye exams? [facility] If pt is not established with a provider, would they like to be referred to a provider to establish care? No .   Dental Screening: Recommended annual dental exams for proper oral hygiene  Community Resource Referral / Chronic Care Management: CRR required this visit?  No   CCM required this visit?  No      Plan:     I have personally reviewed and noted the following in the patient's chart:   . Medical and social history . Use of alcohol, tobacco or illicit drugs  . Current medications and supplements including opioid prescriptions. Patient is not currently taking opioid prescriptions. . Functional ability and status . Nutritional status . Physical activity . Advanced directives . List of other physicians . Hospitalizations, surgeries, and ER visits in previous 12 months . Vitals . Screenings to include  cognitive, depression, and falls . Referrals and appointments  In addition, I have reviewed and discussed with patient certain preventive protocols, quality metrics, and best practice recommendations. A written personalized care plan for preventive services as well as general preventive health recommendations were provided to patient.     Kellie Simmering, Dean   [id number]/[id number]   Nurse Notes:

## 2020-07-08 ENCOUNTER — Other Ambulatory Visit: Payer: Self-pay | Admitting: Nurse Practitioner

## 2020-07-09 DIAGNOSIS — Z20822 Contact with and (suspected) exposure to covid-19: Secondary | ICD-10-CM | POA: Diagnosis not present

## 2020-11-05 ENCOUNTER — Encounter: Payer: BC Managed Care – PPO | Admitting: Nurse Practitioner

## 2020-12-17 LAB — HM HEPATITIS C SCREENING LAB: HM Hepatitis Screen: NEGATIVE

## 2020-12-22 ENCOUNTER — Encounter: Payer: Self-pay | Admitting: Nurse Practitioner

## 2021-01-25 ENCOUNTER — Other Ambulatory Visit: Payer: Self-pay

## 2021-01-25 MED ORDER — HYDROCHLOROTHIAZIDE 25 MG PO TABS: 25.0000 mg | ORAL_TABLET | Freq: Every day | ORAL | 0 refills | Status: DC

## 2021-03-08 ENCOUNTER — Ambulatory Visit: Payer: Medicare Other | Admitting: Nurse Practitioner

## 2021-03-11 ENCOUNTER — Other Ambulatory Visit: Payer: Self-pay

## 2021-03-11 ENCOUNTER — Encounter: Payer: Self-pay | Admitting: Nurse Practitioner

## 2021-03-11 ENCOUNTER — Ambulatory Visit (INDEPENDENT_AMBULATORY_CARE_PROVIDER_SITE_OTHER): Payer: Medicare Other | Admitting: Nurse Practitioner

## 2021-03-11 VITALS — BP 128/80 | HR 76 | Temp 98.2°F | Ht 63.0 in | Wt 156.4 lb

## 2021-03-11 DIAGNOSIS — Z1231 Encounter for screening mammogram for malignant neoplasm of breast: Secondary | ICD-10-CM | POA: Diagnosis not present

## 2021-03-11 DIAGNOSIS — E782 Mixed hyperlipidemia: Secondary | ICD-10-CM | POA: Diagnosis not present

## 2021-03-11 DIAGNOSIS — I10 Essential (primary) hypertension: Secondary | ICD-10-CM

## 2021-03-11 DIAGNOSIS — E2839 Other primary ovarian failure: Secondary | ICD-10-CM | POA: Diagnosis not present

## 2021-03-11 DIAGNOSIS — Z23 Encounter for immunization: Secondary | ICD-10-CM | POA: Diagnosis not present

## 2021-03-11 DIAGNOSIS — Z1211 Encounter for screening for malignant neoplasm of colon: Secondary | ICD-10-CM

## 2021-03-11 DIAGNOSIS — R7303 Prediabetes: Secondary | ICD-10-CM | POA: Diagnosis not present

## 2021-03-11 NOTE — Progress Notes (Signed)
I,Tianna Badgett,acting as a Education administrator for [employer], [name].,have documented all relevant documentation on the behalf of [name], [name],as directed by  [name], [name] while in the presence of [name], [place].  This visit occurred during the SARS-CoV-2 public health emergency.  Safety protocols were in place, including screening questions prior to the visit, additional usage of staff PPE, and extensive cleaning of exam room while observing appropriate contact time as indicated for disinfecting solutions.  Subjective:     Patient ID: [name] , female    DOB: Jun 17, 1950 , 71 y.o.   MRN: [id number]   Chief Complaint  Patient presents with   Hypertension    HPI  Patient presents today for a blood pressure, abnormal glucose and cholesterol f/u   Hypertension This is a chronic problem. The current episode started more than 1 year ago. The problem is controlled. Pertinent negatives include no anxiety, chest pain, headaches or palpitations. There are no associated agents to hypertension. Risk factors for coronary artery disease include sedentary lifestyle and obesity. Past treatments include diuretics. There are no compliance problems.  There is no history of angina. There is no history of chronic renal disease.    Past Medical History:  Diagnosis Date   Arthritis    shoulder - no meds   Hyperlipidemia    Hypertension    SVD (spontaneous vaginal delivery)    x 5     Family History  Problem Relation Age of Onset   Diabetes Mother      Current Outpatient Medications:    hydrochlorothiazide (HYDRODIURIL) 25 MG tablet, Take 1 tablet (25 mg total) by mouth daily., Disp: 90 tablet, Rfl: 0   Allergies  Allergen Reactions   Pollen Extract     Watery Eyes & Runny Nose     Review of Systems  Constitutional: Negative.   Respiratory: Negative.    Cardiovascular: Negative.  Negative for chest pain and palpitations.  Gastrointestinal: Negative.   Neurological: Negative.   Negative for headaches.    Today's Vitals   03/11/21 1440  BP: 128/80  Pulse: 76  Temp: 98.2 F (36.8 C)  TempSrc: Oral  Weight: 156 lb 6.4 oz (70.9 kg)  Height: 5\' 3"  (1.6 m)   Body mass index is 27.71 kg/m.  Wt Readings from Last 3 Encounters:  03/11/21 156 lb 6.4 oz (70.9 kg)  07/02/20 150 lb (68 kg)  05/21/20 149 lb (67.6 kg)    Objective:  Physical Exam Vitals reviewed.  Constitutional:      General: She is not in acute distress.    Appearance: Normal appearance.  Cardiovascular:     Rate and Rhythm: Normal rate and regular rhythm.     Pulses: Normal pulses.     Heart sounds: Normal heart sounds. No murmur heard. Pulmonary:     Effort: Pulmonary effort is normal. No respiratory distress.     Breath sounds: Normal breath sounds. No wheezing.  Neurological:     General: No focal deficit present.     Mental Status: She is alert and oriented to person, place, and time.     Cranial Nerves: No cranial nerve deficit.     Motor: No weakness.  Psychiatric:        Mood and Affect: Mood normal.        Behavior: Behavior normal.        Thought Content: Thought content normal.        Judgment: Judgment normal.  Assessment And Plan:     1. Essential hypertension Comments: Blood pressure is well controlled, continue current medications  2. Mixed hyperlipidemia Comments: diet controlled, continue to avoid fried and fatty foods.   3. Prediabetes Comments: Improved at last visit. Continue to eat a healthy diet.    4. Encounter for screening mammogram for breast cancer Pt instructed on Self Breast Exam.According to ACOG guidelines Women aged 50 and older are recommended to get an annual mammogram. [facility] for appointment scheduing.  Pt encouraged to get annual mammogram - MM Digital Screening; Future  5. Decreased estrogen level - DG Bone Density; Future  6. Encounter for screening colonoscopy According to USPTF Colorectal cancer Screening  guidelines. Colonoscopy is recommended every 10 years, starting at age 44 years. Will refer to GI for colon cancer screening. - Ambulatory referral to Gastroenterology  7. Encounter for immunization - Pneumococcal conjugate vaccine 20-valent (Prevnar 20)     Patient was given opportunity to ask questions. Patient verbalized understanding of the plan and was able to repeat key elements of the plan. All questions were answered to their satisfaction.  [name], [name]   I, [name], [name], have reviewed all documentation for this visit. The documentation on 03/11/21 for the exam, diagnosis, procedures, and orders are all accurate and complete.   IF YOU HAVE BEEN REFERRED TO A SPECIALIST, IT MAY TAKE 1-2 WEEKS TO SCHEDULE/PROCESS THE REFERRAL. IF YOU HAVE NOT HEARD FROM US/SPECIALIST IN TWO WEEKS, PLEASE GIVE [place] A CALL AT [phone] X [phone].   THE PATIENT IS ENCOURAGED TO PRACTICE SOCIAL DISTANCING DUE TO THE COVID-19 PANDEMIC.

## 2021-03-11 NOTE — Patient Instructions (Signed)

## 2021-03-12 LAB — HEMOGLOBIN A1C
Est. average glucose Bld gHb Est-mCnc: 120 mg/dL
Hgb A1c MFr Bld: 5.8 % — ABNORMAL HIGH (ref 4.8–5.6)

## 2021-03-12 LAB — BMP8+EGFR
BUN/Creatinine Ratio: 15 (ref 12–28)
BUN: 11 mg/dL (ref 8–27)
CO2: 28 mmol/L (ref 20–29)
Calcium: 9.6 mg/dL (ref 8.7–10.3)
Chloride: 103 mmol/L (ref 96–106)
Creatinine, Ser: 0.73 mg/dL (ref 0.57–1.00)
Glucose: 80 mg/dL (ref 70–99)
Potassium: 4.1 mmol/L (ref 3.5–5.2)
Sodium: 147 mmol/L — ABNORMAL HIGH (ref 134–144)
eGFR: 89 mL/min/{1.73_m2} (ref >59)

## 2021-03-12 LAB — LIPID PANEL
Chol/HDL Ratio: 8.1 ratio — ABNORMAL HIGH (ref 0.0–4.4)
Cholesterol, Total: 236 mg/dL — ABNORMAL HIGH (ref 100–199)
HDL: 29 mg/dL — ABNORMAL LOW (ref >39)
LDL Chol Calc (NIH): 170 mg/dL — ABNORMAL HIGH (ref 0–99)
Triglycerides: 198 mg/dL — ABNORMAL HIGH (ref 0–149)
VLDL Cholesterol Cal: 37 mg/dL (ref 5–40)

## 2021-04-29 DIAGNOSIS — E785 Hyperlipidemia, unspecified: Secondary | ICD-10-CM | POA: Diagnosis not present

## 2021-04-29 DIAGNOSIS — Z1211 Encounter for screening for malignant neoplasm of colon: Secondary | ICD-10-CM | POA: Diagnosis not present

## 2021-04-29 DIAGNOSIS — K5730 Diverticulosis of large intestine without perforation or abscess without bleeding: Secondary | ICD-10-CM | POA: Diagnosis not present

## 2021-04-29 DIAGNOSIS — K219 Gastro-esophageal reflux disease without esophagitis: Secondary | ICD-10-CM | POA: Diagnosis not present

## 2021-04-29 DIAGNOSIS — I10 Essential (primary) hypertension: Secondary | ICD-10-CM | POA: Diagnosis not present

## 2021-04-30 ENCOUNTER — Other Ambulatory Visit: Payer: Self-pay | Admitting: Nurse Practitioner

## 2021-06-30 DIAGNOSIS — D123 Benign neoplasm of transverse colon: Secondary | ICD-10-CM | POA: Diagnosis not present

## 2021-06-30 DIAGNOSIS — Z1211 Encounter for screening for malignant neoplasm of colon: Secondary | ICD-10-CM | POA: Diagnosis not present

## 2021-06-30 DIAGNOSIS — K635 Polyp of colon: Secondary | ICD-10-CM | POA: Diagnosis not present

## 2021-06-30 DIAGNOSIS — K5730 Diverticulosis of large intestine without perforation or abscess without bleeding: Secondary | ICD-10-CM | POA: Diagnosis not present

## 2021-06-30 DIAGNOSIS — D120 Benign neoplasm of cecum: Secondary | ICD-10-CM | POA: Diagnosis not present

## 2021-06-30 DIAGNOSIS — D122 Benign neoplasm of ascending colon: Secondary | ICD-10-CM | POA: Diagnosis not present

## 2021-06-30 LAB — HM COLONOSCOPY

## 2021-07-05 DIAGNOSIS — H04123 Dry eye syndrome of bilateral lacrimal glands: Secondary | ICD-10-CM | POA: Diagnosis not present

## 2021-07-05 DIAGNOSIS — H40033 Anatomical narrow angle, bilateral: Secondary | ICD-10-CM | POA: Diagnosis not present

## 2021-07-14 ENCOUNTER — Ambulatory Visit: Payer: Medicare HMO | Admitting: Nurse Practitioner

## 2021-07-14 ENCOUNTER — Ambulatory Visit: Payer: Medicare HMO

## 2021-07-22 ENCOUNTER — Other Ambulatory Visit: Payer: Self-pay

## 2021-07-22 ENCOUNTER — Ambulatory Visit (INDEPENDENT_AMBULATORY_CARE_PROVIDER_SITE_OTHER): Payer: Medicare Other

## 2021-07-22 ENCOUNTER — Ambulatory Visit: Payer: Medicare Other | Admitting: Nurse Practitioner

## 2021-07-22 ENCOUNTER — Encounter: Payer: Self-pay | Admitting: Nurse Practitioner

## 2021-07-22 VITALS — BP 110/62 | HR 76 | Temp 98.7°F | Ht 63.0 in | Wt 157.0 lb

## 2021-07-22 DIAGNOSIS — R7303 Prediabetes: Secondary | ICD-10-CM | POA: Diagnosis not present

## 2021-07-22 DIAGNOSIS — Z5320 Procedure and treatment not carried out because of patient's decision for unspecified reasons: Secondary | ICD-10-CM

## 2021-07-22 DIAGNOSIS — I10 Essential (primary) hypertension: Secondary | ICD-10-CM

## 2021-07-22 DIAGNOSIS — E782 Mixed hyperlipidemia: Secondary | ICD-10-CM

## 2021-07-22 DIAGNOSIS — Z0000 Encounter for general adult medical examination without abnormal findings: Secondary | ICD-10-CM | POA: Diagnosis not present

## 2021-07-22 MED ORDER — HYDROCHLOROTHIAZIDE 25 MG PO TABS: ORAL_TABLET | ORAL | 0 refills | Status: DC

## 2021-07-22 NOTE — Progress Notes (Signed)
Subjective:   [name] is a 71 y.o. female who presents for Medicare Annual (Subsequent) preventive examination.  Review of Systems     Cardiac Risk Factors include: advanced age (>21mn, >>73women);dyslipidemia;hypertension     Objective:    Today's Vitals   07/22/21 1446  BP: 110/62  Pulse: 76  Temp: 98.7 F (37.1 C)  TempSrc: Oral  Weight: 157 lb (71.2 kg)  Height: '5\' 3"'$  (1.6 m)   Body mass index is 27.81 kg/m.     07/22/2021    2:55 PM 07/02/2020    2:00 PM 08/14/2019    6:10 PM 02/17/2016    8:47 PM 10/31/2013    5:02 PM 04/01/2013    6:00 PM 03/27/2013   11:21 AM  Advanced Directives  Does Patient Have a Medical Advance Directive? No No No No No Patient has advance directive, copy not in chart Patient does not have advance directive  Type of Advance Directive      Living will   Does patient want to make changes to medical advance directive?      No change requested   Copy of [place] Chart?      Copy requested from family   Would patient like information on creating a medical advance directive? No - Patient declined  Yes (ED - Information included in AVS) No - Patient declined     Pre-existing out of facility DNR order (yellow form or pink MOST form)      No     Current Medications (verified) Outpatient Encounter Medications as of 07/22/2021  Medication Sig   hydrochlorothiazide (HYDRODIURIL) 25 MG tablet TAKE 1 TABLET(25 MG) BY MOUTH DAILY   No facility-administered encounter medications on file as of 07/22/2021.    Allergies (verified) Pollen extract   History: Past Medical History:  Diagnosis Date   Arthritis    shoulder - no meds   Hyperlipidemia    Hypertension    SVD (spontaneous vaginal delivery)    x 5   Past Surgical History:  Procedure Laterality Date   COLONOSCOPY     CYSTOCELE REPAIR N/A 04/01/2013   Procedure: ANTERIOR REPAIR (CYSTOCELE) with Enterocele Repair;  Surgeon: RLovenia Kim MD;  [facility];   Service: Gynecology;  Laterality: N/A;  90 min.   RECTOCELE REPAIR N/A 04/01/2013   Procedure: POSTERIOR REPAIR (RECTOCELE);  Surgeon: RLovenia Kim MD;  [facility];  Service: Gynecology;  Laterality: N/A;   TUBAL LIGATION     VAGINAL HYSTERECTOMY N/A 04/01/2013   Procedure: HYSTERECTOMY TOTAL VAGINAL;  Surgeon: RLovenia Kim MD;  [facility];  Service: Gynecology;  Laterality: N/A;   WISDOM TOOTH EXTRACTION     Family History  Problem Relation Age of Onset   Diabetes Mother    Social History   Socioeconomic History   Marital status: Divorced    Spouse name: Not on file   Number of children: Not on file   Years of education: Not on file   Highest education level: Not on file  Occupational History   Not on file  Tobacco Use   Smoking status: Never   Smokeless tobacco: Never  Vaping Use   Vaping Use: Never used  Substance and Sexual Activity   Alcohol use: Yes    Comment: socially   Drug use: No   Sexual activity: Not Currently    Birth control/protection: Surgical  Other Topics Concern   Not on file  Social History  Narrative   Not on file   Social Determinants of Health   Financial Resource Strain: Low Risk  (07/22/2021)   Overall Financial Resource Strain (CARDIA)    Difficulty of Paying Living Expenses: Not hard at all  Food Insecurity: No Food Insecurity (07/22/2021)   Hunger Vital Sign    Worried About Running Out of Food in the Last Year: Never true    Ran Out of Food in the Last Year: Never true  Transportation Needs: No Transportation Needs (07/22/2021)   PRAPARE - Hydrologist (Medical): No    Lack of Transportation (Non-Medical): No  Physical Activity: Inactive (07/22/2021)   Exercise Vital Sign    Days of Exercise per Week: 0 days    Minutes of Exercise per Session: 0 min  Stress: No Stress Concern Present (07/22/2021)   [place]    Feeling of  Stress : Not at all  Social Connections: Not on file    Tobacco Counseling Counseling given: Not Answered   Clinical Intake:  Pre-visit preparation completed: Yes  Pain : No/denies pain     Nutritional Status: BMI 25 -29 Overweight Nutritional Risks: None Diabetes: No  How often do you need to have someone help you when you read instructions, pamphlets, or other written materials from your doctor or pharmacy?: 1 - Never What is the last grade level you completed in school?: 12th grade  Diabetic? no  Interpreter Needed?: No  Information entered by :: NAllen LPN   Activities of Daily Living    07/22/2021    2:56 PM  In your present state of health, do you have any difficulty performing the following activities:  Hearing? 0  Vision? 0  Difficulty concentrating or making decisions? 0  Walking or climbing stairs? 0  Dressing or bathing? 0  Doing errands, shopping? 0  Preparing Food and eating ? N  Using the Toilet? N  In the past six months, have you accidently leaked urine? N  Do you have problems with loss of bowel control? N  Managing your Medications? N  Managing your Finances? N  Housekeeping or managing your Housekeeping? N    Patient Care Team: [name], [name] as PCP - General (General Practice)  Indicate any recent Medical Services you may have received from other than [facility] providers in the past year (date may be approximate).     Assessment:   This is a routine wellness examination for [name].  Hearing/Vision screen Vision Screening - Comments:: Regular eye exams, [facility]  Dietary issues and exercise activities discussed: Current Exercise Habits: The patient does not participate in regular exercise at present   Goals Addressed             This Visit's Progress    Patient Stated       07/22/2021, start walking again and being more active       Depression Screen    07/22/2021    2:56 PM 07/22/2021    2:38 PM 07/02/2020    2:02 PM  10/31/2019    3:27 PM 10/25/2018    3:56 PM 03/29/2018    3:09 PM  PHQ 2/9 Scores  PHQ - 2 Score 0 0 0 0 0 0    Fall Risk    07/22/2021    2:56 PM 07/22/2021    2:38 PM 07/02/2020    2:00 PM 10/31/2019    3:24 PM 10/25/2018    3:55 PM  Fall Risk   Falls in the past year? 0 0 1 0 0  Comment   fell trying to position a chair    Number falls in past yr: 0 0 0    Injury with Fall? 0 0 0    Risk for fall due to : Medication side effect No Fall Risks Medication side effect    Follow up Falls evaluation completed;Education provided;Falls prevention discussed Falls evaluation completed Falls evaluation completed;Education provided;Falls prevention discussed      FALL RISK PREVENTION PERTAINING TO THE HOME:  Any stairs in or around the home? No  If so, are there any without handrails? N/a Home free of loose throw rugs in walkways, pet beds, electrical cords, etc? Yes  Adequate lighting in your home to reduce risk of falls? Yes   ASSISTIVE DEVICES UTILIZED TO PREVENT FALLS:  Life alert? No  Use of a cane, walker or w/c? No  Grab bars in the bathroom? Yes  Shower chair or bench in shower? No  Elevated toilet seat or a handicapped toilet? Yes   TIMED UP AND GO:  Was the test performed? No .    Gait steady and fast without use of assistive device  Cognitive Function:        07/22/2021    2:57 PM 07/02/2020    2:05 PM  6CIT Screen  What Year? 0 points 0 points  What month? 0 points 0 points  What time? 0 points 0 points  Count back from 20 0 points 0 points  Months in reverse 0 points 0 points  Repeat phrase 2 points 0 points  Total Score 2 points 0 points    Immunizations Immunization History  Administered Date(s) Administered   PFIZER(Purple Top)SARS-COV-2 Vaccination 08/18/2019, 01/12/2020   PNEUMOCOCCAL CONJUGATE-20 03/11/2021   Pneumococcal Polysaccharide-23 09/28/2017   Tdap 12/25/2008, 10/25/2018    TDAP status: Up to date  Flu Vaccine status: Up to  date  Pneumococcal vaccine status: Up to date  Covid-19 vaccine status: Completed vaccines  Qualifies for Shingles Vaccine? Yes   Zostavax completed No   Shingrix Completed?: No.    Education has been provided regarding the importance of this vaccine. Patient has been advised to call insurance company to determine out of pocket expense if they have not yet received this vaccine. Advised may also receive vaccine at local pharmacy or Health Dept. Verbalized acceptance and understanding.  Screening Tests Health Maintenance  Topic Date Due   DEXA SCAN  Never done   MAMMOGRAM  10/07/2017   COVID-19 Vaccine (3 - Pfizer series) 03/08/2020   Zoster Vaccines- Shingrix (1 of 2) 10/22/2021 (Originally 03/24/2001)   INFLUENZA VACCINE  09/14/2021   TETANUS/TDAP  10/24/2028   COLONOSCOPY (Pts 45-8yr Insurance coverage will need to be confirmed)  07/01/2031   Pneumonia Vaccine 70 Years old  Completed   Hepatitis C Screening  Completed   HPV VACCINES  Aged Out    Health Maintenance  Health Maintenance Due  Topic Date Due   DEXA SCAN  Never done   MAMMOGRAM  10/07/2017   COVID-19 Vaccine (3 - Pfizer series) 03/08/2020    Colorectal cancer screening: Type of screening: Colonoscopy. Completed 06/30/2021. Repeat every 5 years  Mammogram status: decline  Bone Density status: due  Lung Cancer Screening: (Low Dose CT Chest recommended if Age 70-80years, 30 pack-year currently smoking OR have quit w/in 15years.) does not qualify.   Lung Cancer Screening Referral: no  Additional Screening:  Hepatitis C Screening: does  qualify; Completed 12/17/2020  Vision Screening: Recommended annual ophthalmology exams for early detection of glaucoma and other disorders of the eye. Is the patient up to date with their annual eye exam?  Yes  Who is the provider or what is the name of the office in which the patient attends annual eye exams? [facility]  If pt is not established with a provider, would they like  to be referred to a provider to establish care? No .   Dental Screening: Recommended annual dental exams for proper oral hygiene  Community Resource Referral / Chronic Care Management: CRR required this visit?  No   CCM required this visit?  No      Plan:     I have personally reviewed and noted the following in the patient's chart:   Medical and social history Use of alcohol, tobacco or illicit drugs  Current medications and supplements including opioid prescriptions.  Functional ability and status Nutritional status Physical activity Advanced directives List of other physicians Hospitalizations, surgeries, and ER visits in previous 12 months Vitals Screenings to include cognitive, depression, and falls Referrals and appointments  In addition, I have reviewed and discussed with patient certain preventive protocols, quality metrics, and best practice recommendations. A written personalized care plan for preventive services as well as general preventive health recommendations were provided to patient.     Kellie Simmering, LPN   05/19/3644   Nurse Notes: none

## 2021-07-22 NOTE — Patient Instructions (Addendum)
Hypertension, Adult High blood pressure (hypertension) is when the force of blood pumping through the arteries is too strong. The arteries are the blood vessels that carry blood from the heart throughout the body. Hypertension forces the heart to work harder to pump blood and may cause arteries to become narrow or stiff. Untreated or uncontrolled hypertension can lead to a heart attack, heart failure, a stroke, kidney disease, and other problems. A blood pressure reading consists of a higher number over a lower number. Ideally, your blood pressure should be below 120/80. The first ("top") number is called the systolic pressure. It is a measure of the pressure in your arteries as your heart beats. The second ("bottom") number is called the diastolic pressure. It is a measure of the pressure in your arteries as the heart relaxes. What are the causes? The exact cause of this condition is not known. There are some conditions that result in high blood pressure. What increases the risk? Certain factors may make you more likely to develop high blood pressure. Some of these risk factors are under your control, including: Smoking. Not getting enough exercise or physical activity. Being overweight. Having too much fat, sugar, calories, or salt (sodium) in your diet. Drinking too much alcohol. Other risk factors include: Having a personal history of heart disease, diabetes, high cholesterol, or kidney disease. Stress. Having a family history of high blood pressure and high cholesterol. Having obstructive sleep apnea. Age. The risk increases with age. What are the signs or symptoms? High blood pressure may not cause symptoms. Very high blood pressure (hypertensive crisis) may cause: Headache. Fast or irregular heartbeats (palpitations). Shortness of breath. Nosebleed. Nausea and vomiting. Vision changes. Severe chest pain, dizziness, and seizures. How is this diagnosed? This condition is diagnosed by  measuring your blood pressure while you are seated, with your arm resting on a flat surface, your legs uncrossed, and your feet flat on the floor. The cuff of the blood pressure monitor will be placed directly against the skin of your upper arm at the level of your heart. Blood pressure should be measured at least twice using the same arm. Certain conditions can cause a difference in blood pressure between your right and left arms. If you have a high blood pressure reading during one visit or you have normal blood pressure with other risk factors, you may be asked to: Return on a different day to have your blood pressure checked again. Monitor your blood pressure at home for 1 week or longer. If you are diagnosed with hypertension, you may have other blood or imaging tests to help your health care provider understand your overall risk for other conditions. How is this treated? This condition is treated by making healthy lifestyle changes, such as eating healthy foods, exercising more, and reducing your alcohol intake. You may be referred for counseling on a healthy diet and physical activity. Your health care provider may prescribe medicine if lifestyle changes are not enough to get your blood pressure under control and if: Your systolic blood pressure is above 130. Your diastolic blood pressure is above 80. Your personal target blood pressure may vary depending on your medical conditions, your age, and other factors. Follow these instructions at home: Eating and drinking  Eat a diet that is high in fiber and potassium, and low in sodium, added sugar, and fat. An example of this eating plan is called the DASH diet. DASH stands for Dietary Approaches to Stop Hypertension. To eat this way: Eat   plenty of fresh fruits and vegetables. Try to fill one half of your plate at each meal with fruits and vegetables. Eat whole grains, such as whole-wheat pasta, brown rice, or whole-grain bread. Fill about one  fourth of your plate with whole grains. Eat or drink low-fat dairy products, such as skim milk or low-fat yogurt. Avoid fatty cuts of meat, processed or cured meats, and poultry with skin. Fill about one fourth of your plate with lean proteins, such as fish, chicken without skin, beans, eggs, or tofu. Avoid pre-made and processed foods. These tend to be higher in sodium, added sugar, and fat. Reduce your daily sodium intake. Many people with hypertension should eat less than 1,500 mg of sodium a day. Do not drink alcohol if: Your health care provider tells you not to drink. You are pregnant, may be pregnant, or are planning to become pregnant. If you drink alcohol: Limit how much you have to: 0-1 drink a day for women. 0-2 drinks a day for men. Know how much alcohol is in your drink. In the [place] one drink equals one 12 oz bottle of beer (355 mL), one 5 oz glass of wine (148 mL), or one 1 oz glass of hard liquor (44 mL). Lifestyle  Work with your health care provider to maintain a healthy body weight or to lose weight. Ask what an ideal weight is for you. Get at least 30 minutes of exercise that causes your heart to beat faster (aerobic exercise) most days of the week. Activities may include walking, swimming, or biking. Include exercise to strengthen your muscles (resistance exercise), such as Pilates or lifting weights, as part of your weekly exercise routine. Try to do these types of exercises for 30 minutes at least 3 days a week. Do not use any products that contain nicotine or tobacco. These products include cigarettes, chewing tobacco, and vaping devices, such as e-cigarettes. If you need help quitting, ask your health care provider. Monitor your blood pressure at home as told by your health care provider. Keep all follow-up visits. This is important. Medicines Take over-the-counter and prescription medicines only as told by your health care provider. Follow directions carefully. Blood  pressure medicines must be taken as prescribed. Do not skip doses of blood pressure medicine. Doing this puts you at risk for problems and can make the medicine less effective. Ask your health care provider about side effects or reactions to medicines that you should watch for. Contact a health care provider if you: Think you are having a reaction to a medicine you are taking. Have headaches that keep coming back (recurring). Feel dizzy. Have swelling in your ankles. Have trouble with your vision. Get help right away if you: Develop a severe headache or confusion. Have unusual weakness or numbness. Feel faint. Have severe pain in your chest or abdomen. Vomit repeatedly. Have trouble breathing. These symptoms may be an emergency. Get help right away. Call 911. Do not wait to see if the symptoms will go away. Do not drive yourself to the hospital. Summary Hypertension is when the force of blood pumping through your arteries is too strong. If this condition is not controlled, it may put you at risk for serious complications. Your personal target blood pressure may vary depending on your medical conditions, your age, and other factors. For most people, a normal blood pressure is less than 120/80. Hypertension is treated with lifestyle changes, medicines, or a combination of both. Lifestyle changes include losing weight, eating a healthy,   low-sodium diet, exercising more, and limiting alcohol. This information is not intended to replace advice given to you by your health care provider. Make sure you discuss any questions you have with your health care provider. Document Revised: 12/08/2020 Document Reviewed: 12/08/2020 Elsevier Patient Education  [place].   The 10-year ASCVD risk score (Arnett DK, et al., 2019) is: 18.8%   Values used to calculate the score:     Age: 75 years     Sex: Female     Is Non-Hispanic African American: Yes     Diabetic: No     Tobacco smoker: Yes      Systolic Blood Pressure: 377 mmHg     Is BP treated: Yes     HDL Cholesterol: 29 mg/dL     Total Cholesterol: 236 mg/dL

## 2021-07-22 NOTE — Progress Notes (Signed)
I,Tianna Badgett,acting as a Education administrator for [employer], [name].,have documented all relevant documentation on the behalf of [name], [name],as directed by  [name], [name] while in the presence of [name], [place].  This visit occurred during the SARS-CoV-2 public health emergency.  Safety protocols were in place, including screening questions prior to the visit, additional usage of staff PPE, and extensive cleaning of exam room while observing appropriate contact time as indicated for disinfecting solutions.  Subjective:     Patient ID: [name] , female    DOB: 03-26-1950 , 71 y.o.   MRN: [id number]   Chief Complaint  Patient presents with   Hypertension    HPI  Patient presents today for a blood pressure, abnormal glucose and cholesterol f/u. Overall doing well. She is   Wt Readings from Last 3 Encounters: 07/22/21 : 157 lb (71.2 kg) 03/11/21 : 156 lb 6.4 oz (70.9 kg) 07/02/20 : 150 lb (68 kg)    Hypertension This is a chronic problem. The current episode started more than 1 year ago. The problem is controlled. Pertinent negatives include no anxiety, chest pain, headaches or palpitations. There are no associated agents to hypertension. Risk factors for coronary artery disease include sedentary lifestyle and obesity. Past treatments include diuretics. There are no compliance problems.  There is no history of angina. There is no history of chronic renal disease.     Past Medical History:  Diagnosis Date   Arthritis    shoulder - no meds   Hyperlipidemia    Hypertension    SVD (spontaneous vaginal delivery)    x 5     Family History  Problem Relation Age of Onset   Diabetes Mother      Current Outpatient Medications:    hydrochlorothiazide (HYDRODIURIL) 25 MG tablet, TAKE 1 TABLET(25 MG) BY MOUTH DAILY, Disp: 90 tablet, Rfl: 0   Allergies  Allergen Reactions   Pollen Extract     Watery Eyes & Runny Nose     Review of Systems  Constitutional: Negative.    Respiratory: Negative.    Cardiovascular: Negative.  Negative for chest pain and palpitations.  Gastrointestinal: Negative.   Neurological: Negative.  Negative for headaches.     Today's Vitals   07/22/21 1439  BP: 110/62  Pulse: 76  Temp: 98.7 F (37.1 C)  TempSrc: Oral  Weight: 157 lb (71.2 kg)  Height: '5\' 3"'  (1.6 m)   Body mass index is 27.81 kg/m.  Wt Readings from Last 3 Encounters:  07/22/21 157 lb (71.2 kg)  03/11/21 156 lb 6.4 oz (70.9 kg)  07/02/20 150 lb (68 kg)    Objective:  Physical Exam Vitals reviewed.  Constitutional:      General: She is not in acute distress.    Appearance: Normal appearance.  Cardiovascular:     Rate and Rhythm: Normal rate and regular rhythm.     Pulses: Normal pulses.     Heart sounds: Normal heart sounds. No murmur heard. Pulmonary:     Effort: Pulmonary effort is normal. No respiratory distress.     Breath sounds: Normal breath sounds. No wheezing.  Neurological:     General: No focal deficit present.     Mental Status: She is alert and oriented to person, place, and time.     Cranial Nerves: No cranial nerve deficit.     Motor: No weakness.  Psychiatric:        Mood and Affect: Mood normal.  Behavior: Behavior normal.        Thought Content: Thought content normal.        Judgment: Judgment normal.         Assessment And Plan:     1. Essential hypertension Comments: Blood pressure is well controlled, continue current medications - BMP8+EGFR - hydrochlorothiazide (HYDRODIURIL) 25 MG tablet; TAKE 1 TABLET(25 MG) BY MOUTH DAILY  Dispense: 90 tablet; Refill: 0  2. Mixed hyperlipidemia Comments: She continues to be resistant to taking a statin, discussed risk related to cardiovascular disease. Offered for her to take statin MWF declined - Lipid panel  3. Prediabetes Comments: Stable at last visit, continue limiting intake of sugary foods and drinks. - Hemoglobin A1c  4. Statin declined Comments: Discussed  ASCVD risk of 18.8.    The 10-year ASCVD risk score (Arnett DK, et al., 2019) is: 18.8%   Values used to calculate the score:     Age: 57 years     Sex: Female     Is Non-Hispanic African American: Yes     Diabetic: No     Tobacco smoker: Yes     Systolic Blood Pressure: 052 mmHg     Is BP treated: Yes     HDL Cholesterol: 29 mg/dL     Total Cholesterol: 236 mg/dL   Patient was given opportunity to ask questions. Patient verbalized understanding of the plan and was able to repeat key elements of the plan. All questions were answered to their satisfaction.  [name], [name]   I, [name], [name], have reviewed all documentation for this visit. The documentation on 07/22/21 for the exam, diagnosis, procedures, and orders are all accurate and complete.   IF YOU HAVE BEEN REFERRED TO A SPECIALIST, IT MAY TAKE 1-2 WEEKS TO SCHEDULE/PROCESS THE REFERRAL. IF YOU HAVE NOT HEARD FROM US/SPECIALIST IN TWO WEEKS, PLEASE GIVE [place] A CALL AT [phone].   THE PATIENT IS ENCOURAGED TO PRACTICE SOCIAL DISTANCING DUE TO THE COVID-19 PANDEMIC.

## 2021-07-22 NOTE — Patient Instructions (Signed)
[name] , Thank you for taking time to come for your Medicare Wellness Visit. I appreciate your ongoing commitment to your health goals. Please review the following plan we discussed and let me know if I can assist you in the future.   Screening recommendations/referrals: Colonoscopy: completed 06/30/2021, due 07/01/2026 Mammogram: decline Bone Density: due Recommended yearly ophthalmology/optometry visit for glaucoma screening and checkup Recommended yearly dental visit for hygiene and checkup  Vaccinations: Influenza vaccine: decline Pneumococcal vaccine: completed 03/11/2021 Tdap vaccine: completed 10/25/2018, due 10/24/2028 Shingles vaccine: discussed   Covid-19: 01/12/2020, 08/18/2019  Advanced directives: Advance directive discussed with you today. Even though you declined this today please call our office should you change your mind and we can give you the proper paperwork for you to fill out.  Conditions/risks identified: none  Next appointment: Follow up in one year for your annual wellness visit    Preventive Care 65 Years and Older, Female Preventive care refers to lifestyle choices and visits with your health care provider that can promote health and wellness. What does preventive care include? A yearly physical exam. This is also called an annual well check. Dental exams once or twice a year. Routine eye exams. Ask your health care provider how often you should have your eyes checked. Personal lifestyle choices, including: Daily care of your teeth and gums. Regular physical activity. Eating a healthy diet. Avoiding tobacco and drug use. Limiting alcohol use. Practicing safe sex. Taking low-dose aspirin every day. Taking vitamin and mineral supplements as recommended by your health care provider. What happens during an annual well check? The services and screenings done by your health care provider during your annual well check will depend on your age, overall health,  lifestyle risk factors, and family history of disease. Counseling  Your health care provider may ask you questions about your: Alcohol use. Tobacco use. Drug use. Emotional well-being. Home and relationship well-being. Sexual activity. Eating habits. History of falls. Memory and ability to understand (cognition). Work and work Statistician. Reproductive health. Screening  You may have the following tests or measurements: Height, weight, and BMI. Blood pressure. Lipid and cholesterol levels. These may be checked every 5 years, or more frequently if you are over 39 years old. Skin check. Lung cancer screening. You may have this screening every year starting at age 24 if you have a 30-pack-year history of smoking and currently smoke or have quit within the past 15 years. Fecal occult blood test (FOBT) of the stool. You may have this test every year starting at age 45. Flexible sigmoidoscopy or colonoscopy. You may have a sigmoidoscopy every 5 years or a colonoscopy every 10 years starting at age 27. Hepatitis C blood test. Hepatitis B blood test. Sexually transmitted disease (STD) testing. Diabetes screening. This is done by checking your blood sugar (glucose) after you have not eaten for a while (fasting). You may have this done every 1-3 years. Bone density scan. This is done to screen for osteoporosis. You may have this done starting at age 10. Mammogram. This may be done every 1-2 years. Talk to your health care provider about how often you should have regular mammograms. Talk with your health care provider about your test results, treatment options, and if necessary, the need for more tests. Vaccines  Your health care provider may recommend certain vaccines, such as: Influenza vaccine. This is recommended every year. Tetanus, diphtheria, and acellular pertussis (Tdap, Td) vaccine. You may need a Td booster every 10 years. Zoster vaccine.  You may need this after age  5. Pneumococcal 13-valent conjugate (PCV13) vaccine. One dose is recommended after age 38. Pneumococcal polysaccharide (PPSV23) vaccine. One dose is recommended after age 93. Talk to your health care provider about which screenings and vaccines you need and how often you need them. This information is not intended to replace advice given to you by your health care provider. Make sure you discuss any questions you have with your health care provider. Document Released: 02/27/2015 Document Revised: 10/21/2015 Document Reviewed: 12/02/2014 Elsevier Interactive Patient Education  2017 [place] Prevention in the Home Falls can cause injuries. They can happen to people of all ages. There are many things you can do to make your home safe and to help prevent falls. What can I do on the outside of my home? Regularly fix the edges of walkways and driveways and fix any cracks. Remove anything that might make you trip as you walk through a door, such as a raised step or threshold. Trim any bushes or trees on the path to your home. Use bright outdoor lighting. Clear any walking paths of anything that might make someone trip, such as rocks or tools. Regularly check to see if handrails are loose or broken. Make sure that both sides of any steps have handrails. Any raised decks and porches should have guardrails on the edges. Have any leaves, snow, or ice cleared regularly. Use sand or salt on walking paths during winter. Clean up any spills in your garage right away. This includes oil or grease spills. What can I do in the bathroom? Use night lights. Install grab bars by the toilet and in the tub and shower. Do not use towel bars as grab bars. Use non-skid mats or decals in the tub or shower. If you need to sit down in the shower, use a plastic, non-slip stool. Keep the floor dry. Clean up any water that spills on the floor as soon as it happens. Remove soap buildup in the tub or shower  regularly. Attach bath mats securely with double-sided non-slip rug tape. Do not have throw rugs and other things on the floor that can make you trip. What can I do in the bedroom? Use night lights. Make sure that you have a light by your bed that is easy to reach. Do not use any sheets or blankets that are too big for your bed. They should not hang down onto the floor. Have a firm chair that has side arms. You can use this for support while you get dressed. Do not have throw rugs and other things on the floor that can make you trip. What can I do in the kitchen? Clean up any spills right away. Avoid walking on wet floors. Keep items that you use a lot in easy-to-reach places. If you need to reach something above you, use a strong step stool that has a grab bar. Keep electrical cords out of the way. Do not use floor polish or wax that makes floors slippery. If you must use wax, use non-skid floor wax. Do not have throw rugs and other things on the floor that can make you trip. What can I do with my stairs? Do not leave any items on the stairs. Make sure that there are handrails on both sides of the stairs and use them. Fix handrails that are broken or loose. Make sure that handrails are as long as the stairways. Check any carpeting to make sure that it is  firmly attached to the stairs. Fix any carpet that is loose or worn. Avoid having throw rugs at the top or bottom of the stairs. If you do have throw rugs, attach them to the floor with carpet tape. Make sure that you have a light switch at the top of the stairs and the bottom of the stairs. If you do not have them, ask someone to add them for you. What else can I do to help prevent falls? Wear shoes that: Do not have high heels. Have rubber bottoms. Are comfortable and fit you well. Are closed at the toe. Do not wear sandals. If you use a stepladder: Make sure that it is fully opened. Do not climb a closed stepladder. Make sure that  both sides of the stepladder are locked into place. Ask someone to hold it for you, if possible. Clearly mark and make sure that you can see: Any grab bars or handrails. First and last steps. Where the edge of each step is. Use tools that help you move around (mobility aids) if they are needed. These include: Canes. Walkers. Scooters. Crutches. Turn on the lights when you go into a dark area. Replace any light bulbs as soon as they burn out. Set up your furniture so you have a clear path. Avoid moving your furniture around. If any of your floors are uneven, fix them. If there are any pets around you, be aware of where they are. Review your medicines with your doctor. Some medicines can make you feel dizzy. This can increase your chance of falling. Ask your doctor what other things that you can do to help prevent falls. This information is not intended to replace advice given to you by your health care provider. Make sure you discuss any questions you have with your health care provider. Document Released: 11/27/2008 Document Revised: 07/09/2015 Document Reviewed: 03/07/2014 Elsevier Interactive Patient Education  2017 [employer].

## 2021-07-23 LAB — BMP8+EGFR
BUN/Creatinine Ratio: 14 (ref 12–28)
BUN: 12 mg/dL (ref 8–27)
CO2: 23 mmol/L (ref 20–29)
Calcium: 9.3 mg/dL (ref 8.7–10.3)
Chloride: 101 mmol/L (ref 96–106)
Creatinine, Ser: 0.87 mg/dL (ref 0.57–1.00)
Glucose: 87 mg/dL (ref 70–99)
Potassium: 3.7 mmol/L (ref 3.5–5.2)
Sodium: 143 mmol/L (ref 134–144)
eGFR: 72 mL/min/{1.73_m2} (ref >59)

## 2021-07-23 LAB — LIPID PANEL
Chol/HDL Ratio: 7.6 ratio — ABNORMAL HIGH (ref 0.0–4.4)
Cholesterol, Total: 228 mg/dL — ABNORMAL HIGH (ref 100–199)
HDL: 30 mg/dL — ABNORMAL LOW (ref >39)
LDL Chol Calc (NIH): 171 mg/dL — ABNORMAL HIGH (ref 0–99)
Triglycerides: 146 mg/dL (ref 0–149)
VLDL Cholesterol Cal: 27 mg/dL (ref 5–40)

## 2021-07-23 LAB — HEMOGLOBIN A1C
Est. average glucose Bld gHb Est-mCnc: 126 mg/dL
Hgb A1c MFr Bld: 6 % — ABNORMAL HIGH (ref 4.8–5.6)

## 2021-07-27 ENCOUNTER — Other Ambulatory Visit: Payer: Self-pay

## 2021-07-27 DIAGNOSIS — I10 Essential (primary) hypertension: Secondary | ICD-10-CM

## 2021-07-27 MED ORDER — HYDROCHLOROTHIAZIDE 25 MG PO TABS: ORAL_TABLET | ORAL | 0 refills | Status: DC

## 2021-09-08 ENCOUNTER — Ambulatory Visit: Payer: Medicare Other | Admitting: Nurse Practitioner

## 2021-10-25 ENCOUNTER — Other Ambulatory Visit: Payer: Self-pay

## 2021-10-25 DIAGNOSIS — I10 Essential (primary) hypertension: Secondary | ICD-10-CM

## 2021-10-25 MED ORDER — HYDROCHLOROTHIAZIDE 25 MG PO TABS: ORAL_TABLET | ORAL | 0 refills | Status: DC

## 2021-12-09 ENCOUNTER — Encounter: Payer: Self-pay | Admitting: Nurse Practitioner

## 2021-12-09 ENCOUNTER — Ambulatory Visit (INDEPENDENT_AMBULATORY_CARE_PROVIDER_SITE_OTHER): Payer: Medicare Other | Admitting: Nurse Practitioner

## 2021-12-09 VITALS — BP 140/70 | HR 86 | Temp 99.1°F | Ht 63.0 in | Wt 158.0 lb

## 2021-12-09 DIAGNOSIS — Z2821 Immunization not carried out because of patient refusal: Secondary | ICD-10-CM

## 2021-12-09 DIAGNOSIS — R7303 Prediabetes: Secondary | ICD-10-CM

## 2021-12-09 DIAGNOSIS — E782 Mixed hyperlipidemia: Secondary | ICD-10-CM

## 2021-12-09 DIAGNOSIS — I10 Essential (primary) hypertension: Secondary | ICD-10-CM | POA: Diagnosis not present

## 2021-12-09 NOTE — Progress Notes (Signed)
Barnet Glasgow Martin,acting as a Education administrator for [name], [name].,have documented all relevant documentation on the behalf of [name], [name],as directed by  [name], [name] while in the presence of [name], [place].    Subjective:     Patient ID: [name] , female    DOB: 10-05-1950 , 71 y.o.   MRN: [id number]   Chief Complaint  Patient presents with   Hypertension    HPI  Patient presents today for a bp check, patients states compliance with medications and has no other issues. She thinks she may have eaten foods that were spicy/salty. She may have even forgot to take her blood pressure medications.   BP Readings from Last 3 Encounters: 12/09/21 : (!) 150/72 07/22/21 : 110/62 07/22/21 : 110/62    Hypertension This is a chronic problem. The current episode started more than 1 year ago. The problem has been gradually worsening since onset. The problem is uncontrolled. Pertinent negatives include no anxiety. Risk factors for coronary artery disease include sedentary lifestyle. Past treatments include diuretics. The current treatment provides mild improvement.     Past Medical History:  Diagnosis Date   Arthritis    shoulder - no meds   Hyperlipidemia    Hypertension    SVD (spontaneous vaginal delivery)    x 5     Family History  Problem Relation Age of Onset   Diabetes Mother      Current Outpatient Medications:    hydrochlorothiazide (HYDRODIURIL) 25 MG tablet, TAKE 1 TABLET(25 MG) BY MOUTH DAILY, Disp: 90 tablet, Rfl: 0   Allergies  Allergen Reactions   Pollen Extract     Watery Eyes & Runny Nose     Review of Systems  Constitutional: Negative.   HENT: Negative.    Eyes: Negative.   Respiratory: Negative.    Cardiovascular: Negative.   Gastrointestinal: Negative.   Psychiatric/Behavioral: Negative.       Today's Vitals   12/09/21 1416  BP: (!) 150/72  Pulse: 86  Temp: 99.1 F (37.3 C)  TempSrc: Oral  Weight: 158 lb (71.7 kg)  Height: 5'  3" (1.6 m)  PainSc: 0-No pain   Body mass index is 27.99 kg/m.  Wt Readings from Last 3 Encounters:  12/09/21 158 lb (71.7 kg)  07/22/21 157 lb (71.2 kg)  07/22/21 157 lb (71.2 kg)    Objective:  Physical Exam Vitals reviewed.  Constitutional:      General: She is not in acute distress.    Appearance: Normal appearance.  Cardiovascular:     Rate and Rhythm: Normal rate and regular rhythm.     Pulses: Normal pulses.     Heart sounds: Normal heart sounds. No murmur heard. Pulmonary:     Effort: Pulmonary effort is normal. No respiratory distress.     Breath sounds: Normal breath sounds. No wheezing.  Skin:    General: Skin is warm and dry.     Capillary Refill: Capillary refill takes less than 2 seconds.  Neurological:     General: No focal deficit present.     Mental Status: She is alert and oriented to person, place, and time.     Cranial Nerves: No cranial nerve deficit.     Motor: No weakness.  Psychiatric:        Mood and Affect: Mood normal.        Behavior: Behavior normal.        Thought Content: Thought content normal.  Judgment: Judgment normal.         Assessment And Plan:     1. Essential hypertension Comments: Blood pressure elevated recheck  - BMP8+eGFR  2. Mixed hyperlipidemia Comments: Cholesterol levels are still slightly elevated. She would likely benefit from a statin however she does not want to take this medication.  - Lipid panel  3. Prediabetes Comments: HgbA1c is slightly up at last visit - Hemoglobin A1c  4. Influenza vaccination declined Patient declined influenza vaccination at this time. Patient is aware that influenza vaccine prevents illness in 70% of healthy people, and reduces hospitalizations to 30-70% in elderly. This vaccine is recommended annually. Education has been provided regarding the importance of this vaccine but patient still declined. Advised may receive this vaccine at local pharmacy or Health Dept.or vaccine  clinic. Aware to provide a copy of the vaccination record if obtained from local pharmacy or Health Dept.  Pt is willing to accept risk associated with refusing vaccination.    Patient was given opportunity to ask questions. Patient verbalized understanding of the plan and was able to repeat key elements of the plan. All questions were answered to their satisfaction.  [name], [name]   I, [name], [name], have reviewed all documentation for this visit. The documentation on 12/09/21 for the exam, diagnosis, procedures, and orders are all accurate and complete.   IF YOU HAVE BEEN REFERRED TO A SPECIALIST, IT MAY TAKE 1-2 WEEKS TO SCHEDULE/PROCESS THE REFERRAL. IF YOU HAVE NOT HEARD FROM US/SPECIALIST IN TWO WEEKS, PLEASE GIVE [place] A CALL AT [phone].   THE PATIENT IS ENCOURAGED TO PRACTICE SOCIAL DISTANCING DUE TO THE COVID-19 PANDEMIC.

## 2021-12-09 NOTE — Patient Instructions (Addendum)
Hypertension, Adult Hypertension is another name for high blood pressure. High blood pressure forces your heart to work harder to pump blood. This can cause problems over time. There are two numbers in a blood pressure reading. There is a top number (systolic) over a bottom number (diastolic). It is best to have a blood pressure that is below 120/80. What are the causes? The cause of this condition is not known. Some other conditions can lead to high blood pressure. What increases the risk? Some lifestyle factors can make you more likely to develop high blood pressure: Smoking. Not getting enough exercise or physical activity. Being overweight. Having too much fat, sugar, calories, or salt (sodium) in your diet. Drinking too much alcohol. Other risk factors include: Having any of these conditions: Heart disease. Diabetes. High cholesterol. Kidney disease. Obstructive sleep apnea. Having a family history of high blood pressure and high cholesterol. Age. The risk increases with age. Stress. What are the signs or symptoms? High blood pressure may not cause symptoms. Very high blood pressure (hypertensive crisis) may cause: Headache. Fast or uneven heartbeats (palpitations). Shortness of breath. Nosebleed. Vomiting or feeling like you may vomit (nauseous). Changes in how you see. Very bad chest pain. Feeling dizzy. Seizures. How is this treated? This condition is treated by making healthy lifestyle changes, such as: Eating healthy foods. Exercising more. Drinking less alcohol. Your doctor may prescribe medicine if lifestyle changes do not help enough and if: Your top number is above 130. Your bottom number is above 80. Your personal target blood pressure may vary. Follow these instructions at home: Eating and drinking  If told, follow the DASH eating plan. To follow this plan: Fill one half of your plate at each meal with fruits and vegetables. Fill one fourth of your plate  at each meal with whole grains. Whole grains include whole-wheat pasta, brown rice, and whole-grain bread. Eat or drink low-fat dairy products, such as skim milk or low-fat yogurt. Fill one fourth of your plate at each meal with low-fat (lean) proteins. Low-fat proteins include fish, chicken without skin, eggs, beans, and tofu. Avoid fatty meat, cured and processed meat, or chicken with skin. Avoid pre-made or processed food. Limit the amount of salt in your diet to less than 1,500 mg each day. Do not drink alcohol if: Your doctor tells you not to drink. You are pregnant, may be pregnant, or are planning to become pregnant. If you drink alcohol: Limit how much you have to: 0-1 drink a day for women. 0-2 drinks a day for men. Know how much alcohol is in your drink. In the [place] one drink equals one 12 oz bottle of beer (355 mL), one 5 oz glass of wine (148 mL), or one 1 oz glass of hard liquor (44 mL). Lifestyle  Work with your doctor to stay at a healthy weight or to lose weight. Ask your doctor what the best weight is for you. Get at least 30 minutes of exercise that causes your heart to beat faster (aerobic exercise) most days of the week. This may include walking, swimming, or biking. Get at least 30 minutes of exercise that strengthens your muscles (resistance exercise) at least 3 days a week. This may include lifting weights or doing Pilates. Do not smoke or use any products that contain nicotine or tobacco. If you need help quitting, ask your doctor. Check your blood pressure at home as told by your doctor. Keep all follow-up visits. Medicines Take over-the-counter and prescription medicines  only as told by your doctor. Follow directions carefully. Do not skip doses of blood pressure medicine. The medicine does not work as well if you skip doses. Skipping doses also puts you at risk for problems. Ask your doctor about side effects or reactions to medicines that you should watch  for. Contact a doctor if: You think you are having a reaction to the medicine you are taking. You have headaches that keep coming back. You feel dizzy. You have swelling in your ankles. You have trouble with your vision. Get help right away if: You get a very bad headache. You start to feel mixed up (confused). You feel weak or numb. You feel faint. You have very bad pain in your: Chest. Belly (abdomen). You vomit more than once. You have trouble breathing. These symptoms may be an emergency. Get help right away. Call 911. Do not wait to see if the symptoms will go away. Do not drive yourself to the hospital. Summary Hypertension is another name for high blood pressure. High blood pressure forces your heart to work harder to pump blood. For most people, a normal blood pressure is less than 120/80. Making healthy choices can help lower blood pressure. If your blood pressure does not get lower with healthy choices, you may need to take medicine. This information is not intended to replace advice given to you by your health care provider. Make sure you discuss any questions you have with your health care provider. Document Revised: 11/19/2020 Document Reviewed: 11/19/2020 Elsevier Patient Education  [place] - call them to schedule Mammogram and Bone density Located in: [facility] Address: [address], [place], [place] [address] Phone: [phone]

## 2021-12-10 LAB — HEMOGLOBIN A1C
Est. average glucose Bld gHb Est-mCnc: 128 mg/dL
Hgb A1c MFr Bld: 6.1 % — ABNORMAL HIGH (ref 4.8–5.6)

## 2021-12-10 LAB — BMP8+EGFR
BUN/Creatinine Ratio: 17 (ref 12–28)
BUN: 15 mg/dL (ref 8–27)
CO2: 26 mmol/L (ref 20–29)
Calcium: 9.5 mg/dL (ref 8.7–10.3)
Chloride: 100 mmol/L (ref 96–106)
Creatinine, Ser: 0.87 mg/dL (ref 0.57–1.00)
Glucose: 112 mg/dL — ABNORMAL HIGH (ref 70–99)
Potassium: 3.9 mmol/L (ref 3.5–5.2)
Sodium: 141 mmol/L (ref 134–144)
eGFR: 72 mL/min/{1.73_m2} (ref >59)

## 2021-12-10 LAB — LIPID PANEL
Chol/HDL Ratio: 7.6 ratio — ABNORMAL HIGH (ref 0.0–4.4)
Cholesterol, Total: 243 mg/dL — ABNORMAL HIGH (ref 100–199)
HDL: 32 mg/dL — ABNORMAL LOW (ref >39)
LDL Chol Calc (NIH): 184 mg/dL — ABNORMAL HIGH (ref 0–99)
Triglycerides: 143 mg/dL (ref 0–149)
VLDL Cholesterol Cal: 27 mg/dL (ref 5–40)

## 2021-12-24 MED ORDER — ATORVASTATIN CALCIUM 20 MG PO TABS: 20.0000 mg | ORAL_TABLET | Freq: Every day | ORAL | 11 refills | Status: DC

## 2022-01-28 ENCOUNTER — Other Ambulatory Visit: Payer: Self-pay

## 2022-01-28 ENCOUNTER — Emergency Department (HOSPITAL_COMMUNITY): Payer: Medicare Other

## 2022-01-28 ENCOUNTER — Emergency Department (HOSPITAL_COMMUNITY)
Admission: EM | Admit: 2022-01-28 | Discharge: 2022-01-28 | Disposition: A | Discharge status: Home / Self Care | Payer: Medicare Other | Attending: Emergency Medicine | Admitting: Emergency Medicine

## 2022-01-28 DIAGNOSIS — Z043 Encounter for examination and observation following other accident: Secondary | ICD-10-CM | POA: Diagnosis not present

## 2022-01-28 DIAGNOSIS — M79622 Pain in left upper arm: Secondary | ICD-10-CM | POA: Diagnosis not present

## 2022-01-28 DIAGNOSIS — Z79899 Other long term (current) drug therapy: Secondary | ICD-10-CM | POA: Diagnosis not present

## 2022-01-28 DIAGNOSIS — W108XXA Fall (on) (from) other stairs and steps, initial encounter: Secondary | ICD-10-CM | POA: Insufficient documentation

## 2022-01-28 DIAGNOSIS — S4992XA Unspecified injury of left shoulder and upper arm, initial encounter: Secondary | ICD-10-CM | POA: Diagnosis not present

## 2022-01-28 DIAGNOSIS — M79632 Pain in left forearm: Secondary | ICD-10-CM | POA: Diagnosis not present

## 2022-01-28 DIAGNOSIS — S8992XA Unspecified injury of left lower leg, initial encounter: Secondary | ICD-10-CM | POA: Diagnosis not present

## 2022-01-28 DIAGNOSIS — W19XXXA Unspecified fall, initial encounter: Secondary | ICD-10-CM

## 2022-01-28 MED ORDER — OXYCODONE-ACETAMINOPHEN 5-325 MG PO TABS: 1.0000 | ORAL_TABLET | Freq: Four times a day (QID) | ORAL | 0 refills | Status: DC | PRN

## 2022-01-28 MED ORDER — OXYCODONE-ACETAMINOPHEN 5-325 MG PO TABS
1.0000 | ORAL_TABLET | Freq: Once | ORAL | Status: AC
  Administered 2022-01-28: 1 via ORAL
  Filled 2022-01-28: qty 1

## 2022-01-28 NOTE — ED Provider Notes (Signed)
[facility] DEPT Provider Note   CSN: [id number] Arrival date & time: 01/28/22  1919     History  Chief Complaint  Patient presents with   Fall   Arm Injury    [name] is a 71 y.o. female.  71 year old female who presents with left-sided pain after fall.  Patient states that she was upstairs and lost her balance and fell onto her left side.  Did not strike her head.  Did not lose any consciousness.  Complains of dull pain to her left arm and left leg.  Denies any decreased range of motion to her left wrist, left elbow, left shoulder.       Home Medications Prior to Admission medications   Medication Sig Start Date End Date Taking? Authorizing Provider  atorvastatin (LIPITOR) 20 MG tablet Take 1 tablet (20 mg total) by mouth daily. 12/24/21 12/24/22  [name], [name]  hydrochlorothiazide (HYDRODIURIL) 25 MG tablet TAKE 1 TABLET(25 MG) BY MOUTH DAILY 10/25/21   [name], [name]      Allergies    Pollen extract    Review of Systems   Review of Systems  All other systems reviewed and are negative.   Physical Exam Updated Vital Signs BP (!) 168/92 (BP Location: Right Arm)   Pulse 82   Temp 98.5 F (36.9 C) (Oral)   Resp 16   Ht 1.6 m ('5\' 3"'$ )   Wt 73.5 kg   SpO2 99%   BMI 28.70 kg/m  Physical Exam Vitals and nursing note reviewed.  Constitutional:      General: She is not in acute distress.    Appearance: Normal appearance. She is well-developed. She is not toxic-appearing.  HENT:     Head: Normocephalic and atraumatic.  Eyes:     General: Lids are normal.     Conjunctiva/sclera: Conjunctivae normal.     Pupils: Pupils are equal, round, and reactive to light.  Neck:     Thyroid: No thyroid mass.     Trachea: No tracheal deviation.  Cardiovascular:     Rate and Rhythm: Normal rate and regular rhythm.     Heart sounds: Normal heart sounds. No murmur heard.    No gallop.  Pulmonary:     Effort: Pulmonary effort is  normal. No respiratory distress.     Breath sounds: Normal breath sounds. No stridor. No decreased breath sounds, wheezing, rhonchi or rales.  Abdominal:     General: There is no distension.     Palpations: Abdomen is soft.     Tenderness: There is no abdominal tenderness. There is no rebound.  Musculoskeletal:        General: No tenderness. Normal range of motion.       Arms:     Cervical back: Normal range of motion and neck supple.       Legs:     Comments: Patient with full range of motion at the left shoulder and elbow.  Range of motion at the left wrist  Skin:    General: Skin is warm and dry.     Findings: No abrasion or rash.  Neurological:     Mental Status: She is alert and oriented to person, place, and time. Mental status is at baseline.     GCS: GCS eye subscore is 4. GCS verbal subscore is 5. GCS motor subscore is 6.     Cranial Nerves: Cranial nerves 2-12 are intact. No cranial nerve deficit.     Sensory:  No sensory deficit.     Motor: Motor function is intact.     Gait: Gait is intact.  Psychiatric:        Attention and Perception: Attention normal.        Speech: Speech normal.        Behavior: Behavior normal.     ED Results / Procedures / Treatments   Labs (all labs ordered are listed, but only abnormal results are displayed) Labs Reviewed - No data to display  EKG None  Radiology DG Hip Unilat W or Wo Pelvis 2-3 Views Left  Result Date: 01/28/2022 CLINICAL DATA:  Fall EXAM: DG HIP (WITH OR WITHOUT PELVIS) 2-3V LEFT COMPARISON:  None Available. FINDINGS: No fracture or dislocation is seen. Bilateral hip joint spaces are preserved. Visualized bony pelvis appears intact. IMPRESSION: Negative. Electronically Signed   By: Julian Hy M.D.   On: 01/28/2022 21:41   DG Forearm Left  Result Date: 01/28/2022 CLINICAL DATA:  Fall EXAM: LEFT FOREARM - 2 VIEW COMPARISON:  None Available. FINDINGS: No fracture or dislocation is seen.  The joint spaces are  preserved. Visualized soft tissues are within normal limits. IMPRESSION: Negative. Electronically Signed   By: Julian Hy M.D.   On: 01/28/2022 21:40   DG Humerus Left  Result Date: 01/28/2022 CLINICAL DATA:  Fall EXAM: LEFT HUMERUS - 2+ VIEW COMPARISON:  None Available. FINDINGS: No fracture or dislocation is seen. The joint spaces are preserved. Visualized soft tissues are within normal limits. Visualized left lung is clear. IMPRESSION: Negative. Electronically Signed   By: Julian Hy M.D.   On: 01/28/2022 21:40    Procedures Procedures    Medications Ordered in ED Medications  oxyCODONE-acetaminophen (PERCOCET/ROXICET) 5-325 MG per tablet 1 tablet (has no administration in time range)    ED Course/ Medical Decision Making/ A&P                           Medical Decision Making Risk Prescription drug management.   Patient medicated for pain here with Percocet.  X-rays of patient's left forearm, left shoulder, left hip and pelvis were reviewed and per my dictation were negative for fracture.  Will discharge patient home with return precautions.        Final Clinical Impression(s) / ED Diagnoses Final diagnoses:  None    Rx / DC Orders ED Discharge Orders     None         Lacretia Leigh, MD 01/28/22 2200

## 2022-01-28 NOTE — ED Triage Notes (Signed)
Patient coming to ED for evaluation of L arm pain s/p fall.  Reports she tripped and fell onto L arm.  Did not hit head.  No reports of LOC.  C/o pain with movement to L arm and elbow.  Swelling noted.

## 2022-01-28 NOTE — ED Provider Triage Note (Signed)
Emergency Medicine Provider Triage Evaluation Note  [name] , a 71 y.o. female  was evaluated in triage.  Pt complains of mechanical fall PTA.  Landed on her left arm.  Has pain diffusely to her left humerus, elbow, forearm.  Also pain to her left hip.  Ambulatory PTA.  Able to lift arm overhead.  Small abrasion on arm.  Dap up-to-date.  No lacerations to suture.  Denies hitting head, LOC, anticoagulation.   Review of Systems  Positive: Fall, left arm, hip pain Negative:   Physical Exam  Ht '5\' 3"'$  (1.6 m)   Wt 73.5 kg   BMI 28.70 kg/m  Gen:   Awake, no distress   Resp:  Normal effort  MSK:   Moves extremities without difficulty, tenderness diffusely to left arm ambulatory Other:    Medical Decision Making  Medically screening exam initiated at 9:07 PM.  Appropriate orders placed.  [name] was informed that the remainder of the evaluation will be completed by another provider, this initial triage assessment does not replace that evaluation, and the importance of remaining in the ED until their evaluation is complete.  fall   Vivika Poythress A, PA-C 01/28/22 2203

## 2022-02-02 ENCOUNTER — Telehealth: Payer: Self-pay

## 2022-02-02 NOTE — Telephone Encounter (Signed)
Transition Care Management Unsuccessful Follow-up Telephone Call  Date of discharge and from where:  01/28/2022  Attempts:  1st Attempt  Reason for unsuccessful TCM follow-up call:  Left voice message

## 2022-02-02 NOTE — Telephone Encounter (Signed)
Transition Care Management Follow-up Telephone Call Date of discharge and from where: 01/28/2022  How have you been since you were released from the hospital? Pt states she has a cold & she feels sore. Patient has denied appointment she will give the office a call if anything changes. Any questions or concerns? No  Items Reviewed: Did the pt receive and understand the discharge instructions provided? Yes  Medications obtained and verified? No  Other? No  Any new allergies since your discharge? No  Dietary orders reviewed? Yes Do you have support at home? Yes   Home Care and Equipment/Supplies: Were home health services ordered? no If so, what is the name of the agency? N/a  Has the agency set up a time to come to the patient's home? no Were any new equipment or medical supplies ordered?  No What is the name of the medical supply agency? N/a Were you able to get the supplies/equipment? no Do you have any questions related to the use of the equipment or supplies? No  Functional Questionnaire: (I = Independent and D = Dependent) ADLs: i  Bathing/Dressing- i  Meal Prep- i  Eating- i  Maintaining continence- i  Transferring/Ambulation- i  Managing Meds- i  Follow up appointments reviewed:  PCP Hospital f/u appt confirmed? No  Scheduled to see n/a on n/a @ n/a. [facility] f/u appt confirmed? No  Scheduled to see n/a on n/a @ n/a. Are transportation arrangements needed? No  If their condition worsens, is the pt aware to call PCP or go to the Emergency Dept.? Yes Was the patient provided with contact information for the PCP's office or ED? Yes Was to pt encouraged to call back with questions or concerns? Yes

## 2022-04-14 ENCOUNTER — Ambulatory Visit: Payer: Medicare Other | Admitting: Nurse Practitioner

## 2022-05-19 ENCOUNTER — Encounter: Payer: Self-pay | Admitting: Nurse Practitioner

## 2022-05-19 ENCOUNTER — Ambulatory Visit (INDEPENDENT_AMBULATORY_CARE_PROVIDER_SITE_OTHER): Payer: Medicare Other | Admitting: Nurse Practitioner

## 2022-05-19 VITALS — BP 132/78 | HR 72 | Temp 98.1°F | Ht 63.0 in | Wt 163.0 lb

## 2022-05-19 DIAGNOSIS — I10 Essential (primary) hypertension: Secondary | ICD-10-CM

## 2022-05-19 DIAGNOSIS — R7303 Prediabetes: Secondary | ICD-10-CM | POA: Diagnosis not present

## 2022-05-19 DIAGNOSIS — W19XXXD Unspecified fall, subsequent encounter: Secondary | ICD-10-CM | POA: Diagnosis not present

## 2022-05-19 DIAGNOSIS — E782 Mixed hyperlipidemia: Secondary | ICD-10-CM

## 2022-05-19 DIAGNOSIS — E2839 Other primary ovarian failure: Secondary | ICD-10-CM | POA: Diagnosis not present

## 2022-05-19 DIAGNOSIS — Z1231 Encounter for screening mammogram for malignant neoplasm of breast: Secondary | ICD-10-CM

## 2022-05-19 NOTE — Progress Notes (Signed)
I,Sheena H Holbrook,acting as a Education administrator for [name], [name].,have documented all relevant documentation on the behalf of [name], [name],as directed by  [name], [name] while in the presence of [name], [place].    Subjective:     Patient ID: [name] , female    DOB: [birth year] , 72 y.o.   MRN: [id number]   Chief Complaint  Patient presents with   Medical Management of Chronic Issues    HPI  Patient presents today for hypertension follow up.   Patient had a fall going up the steps in December, was evaluated in ED; no serious injuries. She had a fall at home when going up the steps she slipped backwards and hit her left elbow. She had pain medication that she used for a short period of time.   The 10-year ASCVD risk score (Arnett DK, et al., 2019) is: 16.5%   Values used to calculate the score:     Age: 81 years     Sex: Female     Is Non-Hispanic African American: Yes     Diabetic: No     Tobacco smoker: No     Systolic Blood Pressure: [id number] mmHg     Is BP treated: Yes     HDL Cholesterol: 32 mg/dL     Total Cholesterol: 243 mg/dL      Past Medical History:  Diagnosis Date   Arthritis    shoulder - no meds   Hyperlipidemia    Hypertension    SVD (spontaneous vaginal delivery)    x 5     Family History  Problem Relation Age of Onset   Diabetes Mother      Current Outpatient Medications:    atorvastatin (LIPITOR) 20 MG tablet, Take 1 tablet (20 mg total) by mouth daily., Disp: 30 tablet, Rfl: 11   hydrochlorothiazide (HYDRODIURIL) 25 MG tablet, TAKE 1 TABLET(25 MG) BY MOUTH DAILY, Disp: 90 tablet, Rfl: 0   oxyCODONE-acetaminophen (PERCOCET/ROXICET) 5-325 MG tablet, Take 1 tablet by mouth every 6 (six) hours as needed for severe pain., Disp: 10 tablet, Rfl: 0   Allergies  Allergen Reactions   Pollen Extract     Watery Eyes & Runny Nose     Review of Systems  Constitutional: Negative.   HENT: Negative.    Eyes: Negative.   Respiratory:  Negative.    Cardiovascular: Negative.   Gastrointestinal: Negative.   Psychiatric/Behavioral: Negative.    All other systems reviewed and are negative.    Today's Vitals   05/19/22 1137  BP: (!) 142/80  Pulse: 72  Temp: 98.1 F (36.7 C)  TempSrc: Oral  SpO2: 98%  Weight: 163 lb (73.9 kg)  Height: 5\' 3"  (1.6 m)   Body mass index is 28.87 kg/m.   Objective:  Physical Exam Vitals reviewed.  Constitutional:      General: She is not in acute distress.    Appearance: Normal appearance.  Cardiovascular:     Rate and Rhythm: Normal rate and regular rhythm.     Pulses: Normal pulses.     Heart sounds: Normal heart sounds. No murmur heard. Pulmonary:     Effort: Pulmonary effort is normal. No respiratory distress.     Breath sounds: Normal breath sounds. No wheezing.  Skin:    General: Skin is warm and dry.     Capillary Refill: Capillary refill takes less than 2 seconds.  Neurological:     General: No focal deficit present.     Mental  Status: She is alert and oriented to person, place, and time.     Cranial Nerves: No cranial nerve deficit.     Motor: No weakness.  Psychiatric:        Mood and Affect: Mood normal.        Behavior: Behavior normal.        Thought Content: Thought content normal.        Judgment: Judgment normal.         Assessment And Plan:     1. Essential hypertension Comments: Feels like blood pressure is up due to running around this morning before her visit. - CMP14 + Anion Gap  2. Prediabetes Comments: HgbA1c is stable. Continue focusing on healthy diet and regular exercise. - Hemoglobin A1c  3. Mixed hyperlipidemia Comments: Cholesterol levels are elevated, she did not start the statin. Will send for Calcium scoring - Lipid panel - CMP14 + Anion Gap - CT CARDIAC SCORING; Future  4. Encounter for screening mammogram for breast cancer Manual breast exam is normal Will refer for mammogram - MM Digital Screening; Future  5. Decreased  estrogen level - DG Bone Density; Future  6. Fall, subsequent encounter  No injuries, discussed fall risk safety   Patient was given opportunity to ask questions. Patient verbalized understanding of the plan and was able to repeat key elements of the plan. All questions were answered to their satisfaction.  Arnette Felts, [name]   I, Arnette Felts, [name], have reviewed all documentation for this visit. The documentation on 05/19/22 for the exam, diagnosis, procedures, and orders are all accurate and complete.   IF YOU HAVE BEEN REFERRED TO A SPECIALIST, IT MAY TAKE 1-2 WEEKS TO SCHEDULE/PROCESS THE REFERRAL. IF YOU HAVE NOT HEARD FROM US/SPECIALIST IN TWO WEEKS, PLEASE GIVE [place] A CALL AT [phone].   THE PATIENT IS ENCOURAGED TO PRACTICE SOCIAL DISTANCING DUE TO THE COVID-19 PANDEMIC.

## 2022-05-20 LAB — CMP14 + ANION GAP
ALT: 12 IU/L (ref 0–32)
AST: 19 IU/L (ref 0–40)
Albumin/Globulin Ratio: 1.5 (ref 1.2–2.2)
Albumin: 4.3 g/dL (ref 3.8–4.8)
Alkaline Phosphatase: 86 IU/L (ref 44–121)
Anion Gap: 14 mmol/L (ref 10.0–18.0)
BUN/Creatinine Ratio: 18 (ref 12–28)
BUN: 15 mg/dL (ref 8–27)
Bilirubin Total: 0.7 mg/dL (ref 0.0–1.2)
CO2: 26 mmol/L (ref 20–29)
Calcium: 9.6 mg/dL (ref 8.7–10.3)
Chloride: 101 mmol/L (ref 96–106)
Creatinine, Ser: 0.85 mg/dL (ref 0.57–1.00)
Globulin, Total: 2.9 g/dL (ref 1.5–4.5)
Glucose: 140 mg/dL — ABNORMAL HIGH (ref 70–99)
Potassium: 3.8 mmol/L (ref 3.5–5.2)
Sodium: 141 mmol/L (ref 134–144)
Total Protein: 7.2 g/dL (ref 6.0–8.5)
eGFR: 73 mL/min/{1.73_m2} (ref >59)

## 2022-05-20 LAB — LIPID PANEL
Chol/HDL Ratio: 7.1 ratio — ABNORMAL HIGH (ref 0.0–4.4)
Cholesterol, Total: 228 mg/dL — ABNORMAL HIGH (ref 100–199)
HDL: 32 mg/dL — ABNORMAL LOW (ref >39)
LDL Chol Calc (NIH): 174 mg/dL — ABNORMAL HIGH (ref 0–99)
Triglycerides: 122 mg/dL (ref 0–149)
VLDL Cholesterol Cal: 22 mg/dL (ref 5–40)

## 2022-05-20 LAB — HEMOGLOBIN A1C
Est. average glucose Bld gHb Est-mCnc: 126 mg/dL
Hgb A1c MFr Bld: 6 % — ABNORMAL HIGH (ref 4.8–5.6)

## 2022-05-30 MED ORDER — ATORVASTATIN CALCIUM 40 MG PO TABS: 40.0000 mg | ORAL_TABLET | Freq: Every day | ORAL | 1 refills | Status: DC

## 2022-07-28 ENCOUNTER — Ambulatory Visit: Payer: Medicare Other | Admitting: Nurse Practitioner

## 2022-07-28 ENCOUNTER — Ambulatory Visit: Payer: Medicare Other

## 2022-08-30 ENCOUNTER — Encounter: Payer: Self-pay | Admitting: Nurse Practitioner

## 2022-08-30 ENCOUNTER — Ambulatory Visit (INDEPENDENT_AMBULATORY_CARE_PROVIDER_SITE_OTHER): Payer: Medicare Other | Admitting: Nurse Practitioner

## 2022-08-30 VITALS — BP 130/80 | HR 77 | Temp 98.4°F | Ht 63.0 in | Wt 163.4 lb

## 2022-08-30 DIAGNOSIS — Z79899 Other long term (current) drug therapy: Secondary | ICD-10-CM | POA: Diagnosis not present

## 2022-08-30 DIAGNOSIS — I10 Essential (primary) hypertension: Secondary | ICD-10-CM

## 2022-08-30 DIAGNOSIS — Z5320 Procedure and treatment not carried out because of patient's decision for unspecified reasons: Secondary | ICD-10-CM

## 2022-08-30 DIAGNOSIS — E782 Mixed hyperlipidemia: Secondary | ICD-10-CM

## 2022-08-30 DIAGNOSIS — R7303 Prediabetes: Secondary | ICD-10-CM | POA: Diagnosis not present

## 2022-08-30 MED ORDER — HYDROCHLOROTHIAZIDE 25 MG PO TABS: ORAL_TABLET | ORAL | 0 refills | Status: DC

## 2022-08-30 NOTE — Progress Notes (Signed)
I,Jameka J Llittleton, CMA,acting as a Neurosurgeon for [employer], [name].,have documented all relevant documentation on the behalf of Helen Felts, [name],as directed by  Helen Felts, [name] while in the presence of Helen Felts, [name].  Subjective:  Patient ID: [name] , female    DOB: 12/14/1950 , 72 y.o.   MRN: [id number]  Chief Complaint  Patient presents with   Hypertension    HPI  Patient presents today for a bp check. Patient reports compliance with her meds. Patient does not have any questions or concerns as this time.   Hypertension This is a chronic problem. The current episode started more than 1 year ago. The problem has been gradually improving since onset. The problem is controlled. Pertinent negatives include no anxiety, chest pain, headaches or palpitations. There are no associated agents to hypertension. Risk factors for coronary artery disease include sedentary lifestyle and obesity. Past treatments include diuretics. There are no compliance problems.  There is no history of angina. There is no history of chronic renal disease.     Past Medical History:  Diagnosis Date   Arthritis    shoulder - no meds   Hyperlipidemia    Hypertension    SVD (spontaneous vaginal delivery)    x 5     Family History  Problem Relation Age of Onset   Diabetes Mother      Current Outpatient Medications:    atorvastatin (LIPITOR) 40 MG tablet, Take 1 tablet (40 mg total) by mouth daily. (Patient not taking: Reported on 08/30/2022), Disp: 90 tablet, Rfl: 1   hydrochlorothiazide (HYDRODIURIL) 25 MG tablet, TAKE 1 TABLET(25 MG) BY MOUTH DAILY, Disp: 90 tablet, Rfl: 0   Allergies  Allergen Reactions   Pollen Extract     Watery Eyes & Runny Nose     Review of Systems  Constitutional: Negative.   Eyes: Negative.   Respiratory: Negative.    Cardiovascular: Negative.  Negative for chest pain and palpitations.  Musculoskeletal: Negative.   Skin: Negative.   Neurological: Negative.   Negative for headaches.  Psychiatric/Behavioral: Negative.       Today's Vitals   08/30/22 1149  BP: 130/80  Pulse: 77  Temp: 98.4 F (36.9 C)  Weight: 163 lb 6.4 oz (74.1 kg)  Height: 5\' 3"  (1.6 m)  PainSc: 0-No pain   Body mass index is 28.95 kg/m.  Wt Readings from Last 3 Encounters:  08/30/22 163 lb 6.4 oz (74.1 kg)  05/19/22 163 lb (73.9 kg)  01/28/22 162 lb (73.5 kg)    The 10-year ASCVD risk score (Arnett DK, et al., 2019) is: 13.4%   Values used to calculate the score:     Age: 67 years     Sex: Female     Is Non-Hispanic African American: Yes     Diabetic: No     Tobacco smoker: No     Systolic Blood Pressure: 130 mmHg     Is BP treated: Yes     HDL Cholesterol: 32 mg/dL     Total Cholesterol: 228 mg/dL  Objective:  Physical Exam Vitals reviewed.  Constitutional:      General: She is not in acute distress.    Appearance: Normal appearance.  Cardiovascular:     Rate and Rhythm: Normal rate and regular rhythm.     Pulses: Normal pulses.     Heart sounds: Normal heart sounds. No murmur heard. Pulmonary:     Effort: Pulmonary effort is normal. No respiratory distress.  Breath sounds: Normal breath sounds. No wheezing.  Skin:    General: Skin is warm and dry.     Capillary Refill: Capillary refill takes less than 2 seconds.  Neurological:     General: No focal deficit present.     Mental Status: She is alert and oriented to person, place, and time.     Cranial Nerves: No cranial nerve deficit.     Motor: No weakness.  Psychiatric:        Mood and Affect: Mood normal.        Behavior: Behavior normal.        Thought Content: Thought content normal.        Judgment: Judgment normal.         Assessment And Plan:  Prediabetes Assessment & Plan: Chronic, stable.  No current medications Encouraged to limit intake of sugary foods and drinks Encouraged to increase physical activity to 150 minutes per week Suggestion is made for her to avoid simple  carbohydrates from her diet including Cakes, Sweet Desserts, Ice Cream, Soda (diet and regular), Sweet Tea, Candies, Chips, Cookies, Store Bought Juices, Alcohol in Excess of 1-2 drinks a day, Artificial Sweeteners, Coffee Creamer, and "Sugar-free" Products. This will help patient to have more stable blood glucose    Orders: -     Hemoglobin A1c  Essential hypertension Assessment & Plan: B/P is fairly controlled  BMP ordered to check renal function.  The importance of regular exercise and dietary modification was stressed to the patient.  Stressed importance of losing ten percent of her body weight to help with B/P control.     Orders: -     Basic metabolic panel -     hydroCHLOROthiazide; TAKE 1 TABLET(25 MG) BY MOUTH DAILY  Dispense: 90 tablet; Refill: 0  Statin declined Assessment & Plan: She is not interested in starting a statin at this time in spite of her ASCVD 10 yr Risk score is 13.4%. ordered a Calcium Risk score but has not done at this time.    Mixed hyperlipidemia Assessment & Plan: No current medications, cholesterol level had increased at last visit and ordered calcium risk score scan but has not done at this time   Other long term (current) drug therapy -     CBC    Return in about 4 months (around 12/31/2022) for bp check.   Patient was given opportunity to ask questions. Patient verbalized understanding of the plan and was able to repeat key elements of the plan. All questions were answered to their satisfaction.   [name], [name], have reviewed all documentation for this visit. The documentation on 08/30/22 for the exam, diagnosis, procedures, and orders are all accurate and complete.    IF YOU HAVE BEEN REFERRED TO A SPECIALIST, IT MAY TAKE 1-2 WEEKS TO SCHEDULE/PROCESS THE REFERRAL. IF YOU HAVE NOT HEARD FROM US/SPECIALIST IN TWO WEEKS, PLEASE GIVE [place] A CALL AT [phone].

## 2022-08-31 ENCOUNTER — Ambulatory Visit: Payer: Medicare Other

## 2022-08-31 LAB — CBC
Hematocrit: 39.3 % (ref 34.0–46.6)
Hemoglobin: 12.6 g/dL (ref 11.1–15.9)
MCH: 26.8 pg (ref 26.6–33.0)
MCHC: 32.1 g/dL (ref 31.5–35.7)
MCV: 84 fL (ref 79–97)
Platelets: 175 10*3/uL (ref 150–450)
RBC: 4.7 x10E6/uL (ref 3.77–5.28)
RDW: 13.3 % (ref 11.7–15.4)
WBC: 7.2 10*3/uL (ref 3.4–10.8)

## 2022-08-31 LAB — HEMOGLOBIN A1C
Est. average glucose Bld gHb Est-mCnc: 123 mg/dL
Hgb A1c MFr Bld: 5.9 % — ABNORMAL HIGH (ref 4.8–5.6)

## 2022-08-31 LAB — BASIC METABOLIC PANEL
BUN/Creatinine Ratio: 24 (ref 12–28)
BUN: 17 mg/dL (ref 8–27)
CO2: 23 mmol/L (ref 20–29)
Calcium: 9.6 mg/dL (ref 8.7–10.3)
Chloride: 102 mmol/L (ref 96–106)
Creatinine, Ser: 0.71 mg/dL (ref 0.57–1.00)
Glucose: 94 mg/dL (ref 70–99)
Potassium: 4 mmol/L (ref 3.5–5.2)
Sodium: 140 mmol/L (ref 134–144)
eGFR: 91 mL/min/{1.73_m2} (ref >59)

## 2022-09-07 ENCOUNTER — Ambulatory Visit: Payer: Medicare Other

## 2022-09-13 NOTE — Assessment & Plan Note (Signed)
B/P is fairly controlled  BMP ordered to check renal function.  The importance of regular exercise and dietary modification was stressed to the patient.  Stressed importance of losing ten percent of her body weight to help with B/P control.

## 2022-09-13 NOTE — Assessment & Plan Note (Signed)
She is not interested in starting a statin at this time in spite of her ASCVD 10 yr Risk score is 13.4%. ordered a Calcium Risk score but has not done at this time.

## 2022-09-13 NOTE — Assessment & Plan Note (Signed)
Chronic, stable.  No current medications Encouraged to limit intake of sugary foods and drinks Encouraged to increase physical activity to 150 minutes per week Suggestion is made for her to avoid simple carbohydrates from her diet including Cakes, Sweet Desserts, Ice Cream, Soda (diet and regular), Sweet Tea, Candies, Chips, Cookies, Store Bought Juices, Alcohol in Excess of 1-2 drinks a day, Artificial Sweeteners, Coffee Creamer, and "Sugar-free" Products. This will help patient to have more stable blood glucose

## 2022-09-13 NOTE — Assessment & Plan Note (Signed)
No current medications, cholesterol level had increased at last visit and ordered calcium risk score scan but has not done at this time

## 2022-09-22 ENCOUNTER — Ambulatory Visit: Payer: Medicare Other

## 2022-09-22 DIAGNOSIS — Z0000 Encounter for general adult medical examination without abnormal findings: Secondary | ICD-10-CM

## 2022-09-22 NOTE — Patient Instructions (Signed)
[name] , Thank you for taking time to come for your Medicare Wellness Visit. I appreciate your ongoing commitment to your health goals. Please review the following plan we discussed and let me know if I can assist you in the future.   Referrals/Orders/Follow-Ups/Clinician Recommendations: none  This is a list of the screening recommended for you and due dates:  Health Maintenance  Topic Date Due   DEXA scan (bone density measurement)  Never done   Mammogram  10/07/2017   COVID-19 Vaccine (3 - Pfizer risk series) 02/09/2020   Flu Shot  09/15/2022   Zoster (Shingles) Vaccine (1 of 2) 11/30/2022*   Medicare Annual Wellness Visit  09/22/2023   DTaP/Tdap/Td vaccine (3 - Td or Tdap) 10/24/2028   Colon Cancer Screening  07/01/2031   Pneumonia Vaccine  Completed   Hepatitis C Screening  Completed   HPV Vaccine  Aged Out  *Topic was postponed. The date shown is not the original due date.    Advanced directives: (Copy Requested) Please bring a copy of your health care power of attorney and living will to the office to be added to your chart at your convenience.  Next Medicare Annual Wellness Visit scheduled for next year: No, office will schedule appointment  Preventive Care 65 Years and Older, Female Preventive care refers to lifestyle choices and visits with your health care provider that can promote health and wellness. What does preventive care include? A yearly physical exam. This is also called an annual well check. Dental exams once or twice a year. Routine eye exams. Ask your health care provider how often you should have your eyes checked. Personal lifestyle choices, including: Daily care of your teeth and gums. Regular physical activity. Eating a healthy diet. Avoiding tobacco and drug use. Limiting alcohol use. Practicing safe sex. Taking low-dose aspirin every day. Taking vitamin and mineral supplements as recommended by your health care provider. What happens during  an annual well check? The services and screenings done by your health care provider during your annual well check will depend on your age, overall health, lifestyle risk factors, and family history of disease. Counseling  Your health care provider may ask you questions about your: Alcohol use. Tobacco use. Drug use. Emotional well-being. Home and relationship well-being. Sexual activity. Eating habits. History of falls. Memory and ability to understand (cognition). Work and work Astronomer. Reproductive health. Screening  You may have the following tests or measurements: Height, weight, and BMI. Blood pressure. Lipid and cholesterol levels. These may be checked every 5 years, or more frequently if you are over 32 years old. Skin check. Lung cancer screening. You may have this screening every year starting at age 62 if you have a 30-pack-year history of smoking and currently smoke or have quit within the past 15 years. Fecal occult blood test (FOBT) of the stool. You may have this test every year starting at age 46. Flexible sigmoidoscopy or colonoscopy. You may have a sigmoidoscopy every 5 years or a colonoscopy every 10 years starting at age 64. Hepatitis C blood test. Hepatitis B blood test. Sexually transmitted disease (STD) testing. Diabetes screening. This is done by checking your blood sugar (glucose) after you have not eaten for a while (fasting). You may have this done every 1-3 years. Bone density scan. This is done to screen for osteoporosis. You may have this done starting at age 58. Mammogram. This may be done every 1-2 years. Talk to your health care provider about how often you  should have regular mammograms. Talk with your health care provider about your test results, treatment options, and if necessary, the need for more tests. Vaccines  Your health care provider may recommend certain vaccines, such as: Influenza vaccine. This is recommended every year. Tetanus,  diphtheria, and acellular pertussis (Tdap, Td) vaccine. You may need a Td booster every 10 years. Zoster vaccine. You may need this after age 41. Pneumococcal 13-valent conjugate (PCV13) vaccine. One dose is recommended after age 5. Pneumococcal polysaccharide (PPSV23) vaccine. One dose is recommended after age 11. Talk to your health care provider about which screenings and vaccines you need and how often you need them. This information is not intended to replace advice given to you by your health care provider. Make sure you discuss any questions you have with your health care provider. Document Released: 02/27/2015 Document Revised: 10/21/2015 Document Reviewed: 12/02/2014 Elsevier Interactive Patient Education  2017 [employer].  Fall Prevention in the Home Falls can cause injuries. They can happen to people of all ages. There are many things you can do to make your home safe and to help prevent falls. What can I do on the outside of my home? Regularly fix the edges of walkways and driveways and fix any cracks. Remove anything that might make you trip as you walk through a door, such as a raised step or threshold. Trim any bushes or trees on the path to your home. Use bright outdoor lighting. Clear any walking paths of anything that might make someone trip, such as rocks or tools. Regularly check to see if handrails are loose or broken. Make sure that both sides of any steps have handrails. Any raised decks and porches should have guardrails on the edges. Have any leaves, snow, or ice cleared regularly. Use sand or salt on walking paths during winter. Clean up any spills in your garage right away. This includes oil or grease spills. What can I do in the bathroom? Use night lights. Install grab bars by the toilet and in the tub and shower. Do not use towel bars as grab bars. Use non-skid mats or decals in the tub or shower. If you need to sit down in the shower, use a plastic,  non-slip stool. Keep the floor dry. Clean up any water that spills on the floor as soon as it happens. Remove soap buildup in the tub or shower regularly. Attach bath mats securely with double-sided non-slip rug tape. Do not have throw rugs and other things on the floor that can make you trip. What can I do in the bedroom? Use night lights. Make sure that you have a light by your bed that is easy to reach. Do not use any sheets or blankets that are too big for your bed. They should not hang down onto the floor. Have a firm chair that has side arms. You can use this for support while you get dressed. Do not have throw rugs and other things on the floor that can make you trip. What can I do in the kitchen? Clean up any spills right away. Avoid walking on wet floors. Keep items that you use a lot in easy-to-reach places. If you need to reach something above you, use a strong step stool that has a grab bar. Keep electrical cords out of the way. Do not use floor polish or wax that makes floors slippery. If you must use wax, use non-skid floor wax. Do not have throw rugs and other things on the  floor that can make you trip. What can I do with my stairs? Do not leave any items on the stairs. Make sure that there are handrails on both sides of the stairs and use them. Fix handrails that are broken or loose. Make sure that handrails are as long as the stairways. Check any carpeting to make sure that it is firmly attached to the stairs. Fix any carpet that is loose or worn. Avoid having throw rugs at the top or bottom of the stairs. If you do have throw rugs, attach them to the floor with carpet tape. Make sure that you have a light switch at the top of the stairs and the bottom of the stairs. If you do not have them, ask someone to add them for you. What else can I do to help prevent falls? Wear shoes that: Do not have high heels. Have rubber bottoms. Are comfortable and fit you well. Are closed  at the toe. Do not wear sandals. If you use a stepladder: Make sure that it is fully opened. Do not climb a closed stepladder. Make sure that both sides of the stepladder are locked into place. Ask someone to hold it for you, if possible. Clearly mark and make sure that you can see: Any grab bars or handrails. First and last steps. Where the edge of each step is. Use tools that help you move around (mobility aids) if they are needed. These include: Canes. Walkers. Scooters. Crutches. Turn on the lights when you go into a dark area. Replace any light bulbs as soon as they burn out. Set up your furniture so you have a clear path. Avoid moving your furniture around. If any of your floors are uneven, fix them. If there are any pets around you, be aware of where they are. Review your medicines with your doctor. Some medicines can make you feel dizzy. This can increase your chance of falling. Ask your doctor what other things that you can do to help prevent falls. This information is not intended to replace advice given to you by your health care provider. Make sure you discuss any questions you have with your health care provider. Document Released: 11/27/2008 Document Revised: 07/09/2015 Document Reviewed: 03/07/2014 Elsevier Interactive Patient Education  2017 [employer].

## 2022-09-22 NOTE — Progress Notes (Signed)
Subjective:   [name] is a 72 y.o. female who presents for Medicare Annual (Subsequent) preventive examination.  Visit Complete: Virtual  I connected with  [name] on 09/22/22 by a audio enabled telemedicine application and verified that I am speaking with the correct person using two identifiers.  Patient Location: Home  Provider Location: Office/Clinic  I discussed the limitations of evaluation and management by telemedicine. The patient expressed understanding and agreed to proceed.  Vital Signs: Unable to obtain new vitals due to this being a telehealth visit.  Review of Systems     Cardiac Risk Factors include: advanced age (>84men, >47 women);dyslipidemia;hypertension     Objective:    Today's Vitals   There is no height or weight on file to calculate BMI.     09/22/2022    1:50 PM 01/28/2022    9:04 PM 07/22/2021    2:55 PM 07/02/2020    2:00 PM 08/14/2019    6:10 PM 02/17/2016    8:47 PM 10/31/2013    5:02 PM  Advanced Directives  Does Patient Have a Medical Advance Directive? Yes No No No No No No  Type of Estate agent of [place];Living will        Copy of Healthcare Power of Attorney in Chart? No - copy requested        Would patient like information on creating a medical advance directive?  No - Patient declined No - Patient declined  Yes (ED - Information included in AVS) No - Patient declined     Current Medications (verified) Outpatient Encounter Medications as of 09/22/2022  Medication Sig   hydrochlorothiazide (HYDRODIURIL) 25 MG tablet TAKE 1 TABLET(25 MG) BY MOUTH DAILY   atorvastatin (LIPITOR) 40 MG tablet Take 1 tablet (40 mg total) by mouth daily. (Patient not taking: Reported on 08/30/2022)   No facility-administered encounter medications on file as of 09/22/2022.    Allergies (verified) Pollen extract   History: Past Medical History:  Diagnosis Date   Arthritis    shoulder - no meds   Hyperlipidemia     Hypertension    SVD (spontaneous vaginal delivery)    x 5   Past Surgical History:  Procedure Laterality Date   COLONOSCOPY     CYSTOCELE REPAIR N/A 04/01/2013   Procedure: ANTERIOR REPAIR (CYSTOCELE) with Enterocele Repair;  Surgeon: Lenoard Aden, MD;  Location: [facility];  Service: Gynecology;  Laterality: N/A;  90 min.   RECTOCELE REPAIR N/A 04/01/2013   Procedure: POSTERIOR REPAIR (RECTOCELE);  Surgeon: Lenoard Aden, MD;  Location: [facility];  Service: Gynecology;  Laterality: N/A;   TUBAL LIGATION     VAGINAL HYSTERECTOMY N/A 04/01/2013   Procedure: HYSTERECTOMY TOTAL VAGINAL;  Surgeon: Lenoard Aden, MD;  Location: [facility];  Service: Gynecology;  Laterality: N/A;   WISDOM TOOTH EXTRACTION     Family History  Problem Relation Age of Onset   Diabetes Mother    Social History   Socioeconomic History   Marital status: Divorced    Spouse name: Not on file   Number of children: Not on file   Years of education: Not on file   Highest education level: Not on file  Occupational History   Not on file  Tobacco Use   Smoking status: Never   Smokeless tobacco: Never  Vaping Use   Vaping status: Never Used  Substance and Sexual Activity   Alcohol use: Not Currently    Comment: ocassionally  Drug use: No   Sexual activity: Not Currently    Birth control/protection: Surgical  Other Topics Concern   Not on file  Social History Narrative   Not on file   Social Determinants of Health   Financial Resource Strain: Low Risk  (09/22/2022)   Overall Financial Resource Strain (CARDIA)    Difficulty of Paying Living Expenses: Not hard at all  Food Insecurity: No Food Insecurity (09/22/2022)   Hunger Vital Sign    Worried About Running Out of Food in the Last Year: Never true    Ran Out of Food in the Last Year: Never true  Transportation Needs: No Transportation Needs (09/22/2022)   PRAPARE - Administrator, Civil Service (Medical): No    Lack of Transportation  (Non-Medical): No  Physical Activity: Sufficiently Active (09/22/2022)   Exercise Vital Sign    Days of Exercise per Week: 7 days    Minutes of Exercise per Session: 30 min  Stress: No Stress Concern Present (09/22/2022)   [employer] of Occupational Health - Occupational Stress Questionnaire    Feeling of Stress : Not at all  Social Connections: Moderately Isolated (09/22/2022)   Social Connection and Isolation Panel [NHANES]    Frequency of Communication with Friends and Family: More than three times a week    Frequency of Social Gatherings with Friends and Family: More than three times a week    Attends Religious Services: 1 to 4 times per year    Active Member of [employer] or Organizations: No    Attends Engineer, structural: Never    Marital Status: Divorced    Tobacco Counseling Counseling given: Not Answered   Clinical Intake:  Pre-visit preparation completed: Yes  Pain : No/denies pain     Nutritional Risks: None Diabetes: No  How often do you need to have someone help you when you read instructions, pamphlets, or other written materials from your doctor or pharmacy?: 1 - Never  Interpreter Needed?: No  Information entered by :: NAllen LPN   Activities of Daily Living    09/22/2022    1:43 PM  In your present state of health, do you have any difficulty performing the following activities:  Hearing? 0  Vision? 0  Difficulty concentrating or making decisions? 0  Walking or climbing stairs? 0  Dressing or bathing? 0  Doing errands, shopping? 0  Preparing Food and eating ? N  Using the Toilet? N  In the past six months, have you accidently leaked urine? N  Do you have problems with loss of bowel control? N  Managing your Medications? N  Managing your Finances? N  Housekeeping or managing your Housekeeping? N    Patient Care Team: Arnette Felts, [name] as PCP - General (General Practice)  Indicate any recent Medical Services you may have received from  other than [facility] providers in the past year (date may be approximate).     Assessment:   This is a routine wellness examination for [name].  Hearing/Vision screen Hearing Screening - Comments:: Denies hearing issues Vision Screening - Comments:: Regular eye exams, Dr. Dierdre Searles  Dietary issues and exercise activities discussed:     Goals Addressed             This Visit's Progress    Patient Stated       09/22/2022, keeping cholesterol down       Depression Screen    09/22/2022    1:53 PM 05/19/2022   11:37 AM  12/09/2021    2:15 PM 07/22/2021    2:56 PM 07/22/2021    2:38 PM 07/02/2020    2:02 PM 10/31/2019    3:27 PM  PHQ 2/9 Scores  PHQ - 2 Score 0 0 0 0 0 0 0  PHQ- 9 Score 0          Fall Risk    09/22/2022    1:50 PM 05/19/2022   11:43 AM 05/19/2022   11:36 AM 12/09/2021    2:15 PM 07/22/2021    2:56 PM  Fall Risk   Falls in the past year? 1  1 0 0  Comment fell off of step      Number falls in past yr: 0 0 0 0 0  Injury with Fall? 0 1 1 0 0  Risk for fall due to : Medication side effect   No Fall Risks Medication side effect  Follow up Falls prevention discussed;Falls evaluation completed   Falls evaluation completed Falls evaluation completed;Education provided;Falls prevention discussed    MEDICARE RISK AT HOME:  Medicare Risk at Home - 09/22/22 1352     Any stairs in or around the home? No    If so, are there any without handrails? No    Home free of loose throw rugs in walkways, pet beds, electrical cords, etc? Yes    Adequate lighting in your home to reduce risk of falls? Yes    Life alert? No    Use of a cane, walker or w/c? No    Grab bars in the bathroom? Yes    Shower chair or bench in shower? No    Elevated toilet seat or a handicapped toilet? Yes             TIMED UP AND GO:  Was the test performed?  No    Cognitive Function:        09/22/2022    1:55 PM 07/22/2021    2:57 PM 07/02/2020    2:05 PM  6CIT Screen  What Year? 0 points 0 points 0  points  What month? 0 points 0 points 0 points  What time? 0 points 0 points 0 points  Count back from 20 0 points 0 points 0 points  Months in reverse 0 points 0 points 0 points  Repeat phrase 0 points 2 points 0 points  Total Score 0 points 2 points 0 points    Immunizations Immunization History  Administered Date(s) Administered   PFIZER(Purple Top)SARS-COV-2 Vaccination 08/18/2019, 01/12/2020   PNEUMOCOCCAL CONJUGATE-20 03/11/2021   Pneumococcal Polysaccharide-23 09/28/2017   Tdap 12/25/2008, 10/25/2018    TDAP status: Up to date  Flu Vaccine status: Declined, Education has been provided regarding the importance of this vaccine but patient still declined. Advised may receive this vaccine at local pharmacy or Health Dept. Aware to provide a copy of the vaccination record if obtained from local pharmacy or Health Dept. Verbalized acceptance and understanding.  Pneumococcal vaccine status: Up to date  Covid-19 vaccine status: Information provided on how to obtain vaccines.   Qualifies for Shingles Vaccine? Yes   Zostavax completed No   Shingrix Completed?: No.    Education has been provided regarding the importance of this vaccine. Patient has been advised to call insurance company to determine out of pocket expense if they have not yet received this vaccine. Advised may also receive vaccine at local pharmacy or Health Dept. Verbalized acceptance and understanding.  Screening Tests Health Maintenance  Topic Date Due  DEXA SCAN  Never done   MAMMOGRAM  10/07/2017   COVID-19 Vaccine (3 - Pfizer risk series) 02/09/2020   INFLUENZA VACCINE  09/15/2022   Zoster Vaccines- Shingrix (1 of 2) 11/30/2022 (Originally 03/24/1969)   Medicare Annual Wellness (AWV)  09/22/2023   DTaP/Tdap/Td (3 - Td or Tdap) 10/24/2028   Colonoscopy  07/01/2031   Pneumonia Vaccine 63+ Years old  Completed   Hepatitis C Screening  Completed   HPV VACCINES  Aged Out    Health Maintenance  Health  Maintenance Due  Topic Date Due   DEXA SCAN  Never done   MAMMOGRAM  10/07/2017   COVID-19 Vaccine (3 - Pfizer risk series) 02/09/2020   INFLUENZA VACCINE  09/15/2022    Colorectal cancer screening: Type of screening: Colonoscopy. Completed 06/30/2021. Repeat every 5 years  Mammogram status: Ordered 05/19/2022. Pt provided with contact info and advised to call to schedule appt.   Bone Density status: Ordered 05/19/2022. Pt provided with contact info and advised to call to schedule appt.  Lung Cancer Screening: (Low Dose CT Chest recommended if Age 11-80 years, 20 pack-year currently smoking OR have quit w/in 15years.) does not qualify.   Lung Cancer Screening Referral: no  Additional Screening:  Hepatitis C Screening: does qualify; Completed 12/17/2020  Vision Screening: Recommended annual ophthalmology exams for early detection of glaucoma and other disorders of the eye. Is the patient up to date with their annual eye exam?  Yes  Who is the provider or what is the name of the office in which the patient attends annual eye exams? Dr. Dierdre Searles If pt is not established with a provider, would they like to be referred to a provider to establish care? No .   Dental Screening: Recommended annual dental exams for proper oral hygiene  Diabetic Foot Exam: n/a  Community Resource Referral / Chronic Care Management: CRR required this visit?  No   CCM required this visit?  No     Plan:     I have personally reviewed and noted the following in the patient's chart:   Medical and social history Use of alcohol, tobacco or illicit drugs  Current medications and supplements including opioid prescriptions. Patient is not currently taking opioid prescriptions. Functional ability and status Nutritional status Physical activity Advanced directives List of other physicians Hospitalizations, surgeries, and ER visits in previous 12 months Vitals Screenings to include cognitive, depression, and  falls Referrals and appointments  In addition, I have reviewed and discussed with patient certain preventive protocols, quality metrics, and best practice recommendations. A written personalized care plan for preventive services as well as general preventive health recommendations were provided to patient.     Barb Merino, LPN   [id number]/[id number]   After Visit Summary: (Pick Up) Due to this being a telephonic visit, with patients personalized plan was offered to patient and patient has requested to Pick up at office.  Nurse Notes: none

## 2022-11-28 ENCOUNTER — Other Ambulatory Visit: Payer: Self-pay | Admitting: Nurse Practitioner

## 2022-11-28 DIAGNOSIS — E782 Mixed hyperlipidemia: Secondary | ICD-10-CM

## 2022-12-01 ENCOUNTER — Other Ambulatory Visit: Payer: Self-pay | Admitting: Nurse Practitioner

## 2022-12-01 DIAGNOSIS — I10 Essential (primary) hypertension: Secondary | ICD-10-CM

## 2023-01-05 ENCOUNTER — Ambulatory Visit: Payer: Medicare Other | Admitting: Nurse Practitioner

## 2023-01-05 ENCOUNTER — Encounter: Payer: Self-pay | Admitting: Nurse Practitioner

## 2023-01-05 VITALS — BP 102/80 | HR 95 | Temp 98.6°F | Ht 63.0 in | Wt 165.0 lb

## 2023-01-05 DIAGNOSIS — Z6829 Body mass index (BMI) 29.0-29.9, adult: Secondary | ICD-10-CM | POA: Diagnosis not present

## 2023-01-05 DIAGNOSIS — E782 Mixed hyperlipidemia: Secondary | ICD-10-CM

## 2023-01-05 DIAGNOSIS — E663 Overweight: Secondary | ICD-10-CM

## 2023-01-05 DIAGNOSIS — Z5320 Procedure and treatment not carried out because of patient's decision for unspecified reasons: Secondary | ICD-10-CM | POA: Diagnosis not present

## 2023-01-05 DIAGNOSIS — I10 Essential (primary) hypertension: Secondary | ICD-10-CM

## 2023-01-05 DIAGNOSIS — Z2821 Immunization not carried out because of patient refusal: Secondary | ICD-10-CM | POA: Diagnosis not present

## 2023-01-05 NOTE — Progress Notes (Signed)
Madelaine Bhat, CMA,acting as a Neurosurgeon for Arnette Felts, [name].,have documented all relevant documentation on the behalf of Arnette Felts, [name],as directed by  Arnette Felts, [name] while in the presence of Arnette Felts, [name].  Subjective:  Patient ID: [name] , female    DOB: March 15, 1950 , 72 y.o.   MRN: [id number]  Chief Complaint  Patient presents with   Hypertension    HPI  Patient presents today for a bp and chol follow up, Patient reports compliance with medication. Patient denies any chest pain, SOB, or headaches. Patient has no concerns today.      Past Medical History:  Diagnosis Date   Arthritis    shoulder - no meds   Hyperlipidemia    Hypertension    SVD (spontaneous vaginal delivery)    x 5     Family History  Problem Relation Age of Onset   Diabetes Mother      Current Outpatient Medications:    hydrochlorothiazide (HYDRODIURIL) 25 MG tablet, TAKE 1 TABLET(25 MG) BY MOUTH DAILY, Disp: 90 tablet, Rfl: 0   atorvastatin (LIPITOR) 40 MG tablet, TAKE 1 TABLET(40 MG) BY MOUTH DAILY (Patient not taking: Reported on 01/05/2023), Disp: 90 tablet, Rfl: 1   Allergies  Allergen Reactions   Pollen Extract     Watery Eyes & Runny Nose     Review of Systems  Constitutional: Negative.   Eyes: Negative.   Respiratory: Negative.    Cardiovascular: Negative.  Negative for chest pain, palpitations and leg swelling.  Musculoskeletal: Negative.   Skin: Negative.   Neurological: Negative.  Negative for headaches.  Psychiatric/Behavioral: Negative.       Today's Vitals   01/05/23 1501  BP: 102/80  Pulse: 95  Temp: 98.6 F (37 C)  TempSrc: Oral  Weight: 165 lb (74.8 kg)  Height: 5\' 3"  (1.6 m)  PainSc: 0-No pain   Body mass index is 29.23 kg/m.  Wt Readings from Last 3 Encounters:  01/05/23 165 lb (74.8 kg)  08/30/22 163 lb 6.4 oz (74.1 kg)  05/19/22 163 lb (73.9 kg)    The 10-year ASCVD risk score (Arnett DK, et al., 2019) is: 8.7%   Values used to  calculate the score:     Age: 35 years     Sex: Female     Is Non-Hispanic African American: Yes     Diabetic: No     Tobacco smoker: No     Systolic Blood Pressure: 102 mmHg     Is BP treated: Yes     HDL Cholesterol: 30 mg/dL     Total Cholesterol: 228 mg/dL   Objective:  Physical Exam Vitals reviewed.  Constitutional:      General: She is not in acute distress.    Appearance: Normal appearance.  Cardiovascular:     Rate and Rhythm: Normal rate and regular rhythm.     Pulses: Normal pulses.     Heart sounds: Normal heart sounds. No murmur heard. Pulmonary:     Effort: Pulmonary effort is normal. No respiratory distress.     Breath sounds: Normal breath sounds. No wheezing.  Skin:    General: Skin is warm and dry.     Capillary Refill: Capillary refill takes less than 2 seconds.  Neurological:     General: No focal deficit present.     Mental Status: She is alert and oriented to person, place, and time.     Cranial Nerves: No cranial nerve deficit.     Motor:  No weakness.  Psychiatric:        Mood and Affect: Mood normal.        Behavior: Behavior normal.        Thought Content: Thought content normal.        Judgment: Judgment normal.         Assessment And Plan:  Essential hypertension Assessment & Plan: B/P is fairly controlled  BMP ordered to check renal function.  The importance of regular exercise and dietary modification was stressed to the patient.  Stressed importance of losing ten percent of her body weight to help with B/P control.     Orders: -     BMP8+eGFR  Mixed hyperlipidemia Assessment & Plan: No current medications, cholesterol level is stable.  Calcium risk score scan has been ordered she has not still gone to have this done. ASCVD risk score is 8.7%  Orders: -     BMP8+eGFR -     Lipid panel  Influenza vaccination declined Assessment & Plan: Patient declined influenza vaccination at this time. Patient is aware that influenza  vaccine prevents illness in 70% of healthy people, and reduces hospitalizations to 30-70% in elderly. This vaccine is recommended annually. Education has been provided regarding the importance of this vaccine but patient still declined. Advised may receive this vaccine at local pharmacy or Health Dept.or vaccine clinic. Aware to provide a copy of the vaccination record if obtained from local pharmacy or Health Dept.  Pt is willing to accept risk associated with refusing vaccination.    COVID-19 vaccination declined Assessment & Plan: Declines covid 19 vaccine. Discussed risk of covid 70 and if she changes her mind about the vaccine to call the office. Education has been provided regarding the importance of this vaccine but patient still declined. Advised may receive this vaccine at local pharmacy or Health Dept.or vaccine clinic. Aware to provide a copy of the vaccination record if obtained from local pharmacy or Health Dept.  Encouraged to take multivitamin, vitamin d, vitamin c and zinc to increase immune system. Aware can call office if would like to have vaccine here at office. Verbalized acceptance and understanding.    Herpes zoster vaccination declined Assessment & Plan: Declines shingrix, educated on disease process and is aware if he changes his mind to notify office    Overweight with body mass index (BMI) of 29 to 29.9 in adult  Statin declined Assessment & Plan: Discussed risk of cardiovascular disease.    Return for 6 month bp check.   Patient was given opportunity to ask questions. Patient verbalized understanding of the plan and was able to repeat key elements of the plan. All questions were answered to their satisfaction.    [name], [name], have reviewed all documentation for this visit. The documentation on 01/11/23 for the exam, diagnosis, procedures, and orders are all accurate and complete.   IF YOU HAVE BEEN REFERRED TO A SPECIALIST, IT MAY TAKE 1-2 WEEKS TO  SCHEDULE/PROCESS THE REFERRAL. IF YOU HAVE NOT HEARD FROM US/SPECIALIST IN TWO WEEKS, PLEASE GIVE [place] A CALL AT [phone].

## 2023-01-05 NOTE — Patient Instructions (Addendum)
[facility] [address] [place] [id number]  [phone] Call to schedule your Calcium Risk Scan at [phone] this will help determine if you have plaque build up, the cost is $99 cash pay no insurance covers this

## 2023-01-06 LAB — LIPID PANEL
Chol/HDL Ratio: 7.6 ratio — ABNORMAL HIGH (ref 0.0–4.4)
Cholesterol, Total: 228 mg/dL — ABNORMAL HIGH (ref 100–199)
HDL: 30 mg/dL — ABNORMAL LOW (ref >39)
LDL Chol Calc (NIH): 173 mg/dL — ABNORMAL HIGH (ref 0–99)
Triglycerides: 136 mg/dL (ref 0–149)
VLDL Cholesterol Cal: 25 mg/dL (ref 5–40)

## 2023-01-06 LAB — BMP8+EGFR
BUN/Creatinine Ratio: 13 (ref 12–28)
BUN: 10 mg/dL (ref 8–27)
CO2: 25 mmol/L (ref 20–29)
Calcium: 9.5 mg/dL (ref 8.7–10.3)
Chloride: 101 mmol/L (ref 96–106)
Creatinine, Ser: 0.76 mg/dL (ref 0.57–1.00)
Glucose: 66 mg/dL — ABNORMAL LOW (ref 70–99)
Potassium: 3.8 mmol/L (ref 3.5–5.2)
Sodium: 142 mmol/L (ref 134–144)
eGFR: 84 mL/min/{1.73_m2} (ref >59)

## 2023-01-11 NOTE — Assessment & Plan Note (Signed)

## 2023-01-11 NOTE — Assessment & Plan Note (Signed)
B/P is fairly controlled  BMP ordered to check renal function.  The importance of regular exercise and dietary modification was stressed to the patient.  Stressed importance of losing ten percent of her body weight to help with B/P control.

## 2023-01-11 NOTE — Assessment & Plan Note (Addendum)
No current medications, cholesterol level is stable.  Calcium risk score scan has been ordered she has not still gone to have this done. ASCVD risk score is 8.7%

## 2023-01-11 NOTE — Assessment & Plan Note (Signed)
Declines shingrix, educated on disease process and is aware if he changes his mind to notify office  

## 2023-01-11 NOTE — Assessment & Plan Note (Signed)
Discussed risk of cardiovascular disease.

## 2023-01-11 NOTE — Assessment & Plan Note (Signed)

## 2023-03-03 ENCOUNTER — Telehealth: Payer: Self-pay

## 2023-03-03 DIAGNOSIS — Z1231 Encounter for screening mammogram for malignant neoplasm of breast: Secondary | ICD-10-CM

## 2023-03-03 DIAGNOSIS — E2839 Other primary ovarian failure: Secondary | ICD-10-CM

## 2023-03-03 NOTE — Telephone Encounter (Signed)
This nurse followed up with patient in regards to her mammogram and bone density. She still has not completed. Orders placed for both. Elisha Ponder LPN [facility] AWV Team Direct dial:  [phone]

## 2023-07-05 ENCOUNTER — Ambulatory Visit: Payer: Medicare Other | Admitting: Nurse Practitioner

## 2023-07-05 NOTE — Progress Notes (Unsigned)
 [place], CMA,acting as a Neurosurgeon for Helen Epley, [name].,have documented all relevant documentation on the behalf of Helen Epley, [name],as directed by  Helen Epley, [name] while in the presence of Helen Epley, [name].  Subjective:  Patient ID: [name] , female    DOB: 03/15/1950 , 73 y.o.   MRN: [id number]  No chief complaint on file.   HPI  Endorses seasonal allergies, has been using benadryl , would like to try something that would make her less drowsy so she can take it during daytime hours.     Eats a regular diet, has not been doing as well as she was.  She is working on cooking more meals at home instead of grab and go meals.  Walks in the mornings, 30 minutes at a time 5 days a week.  Drinks 2-3 bottles of water a day, drinks 7 bottles during the summer months.      Hypertension This is a chronic problem. The current episode started more than 1 year ago. The problem is controlled. Associated symptoms include headaches. There are no associated agents to hypertension. Risk factors for coronary artery disease include dyslipidemia. Past treatments include diuretics. The current treatment provides significant improvement. There are no compliance problems.  There is no history of angina or kidney disease.     Past Medical History:  Diagnosis Date   Arthritis    shoulder - no meds   Hyperlipidemia    Hypertension    SVD (spontaneous vaginal delivery)    x 5     Family History  Problem Relation Age of Onset   Diabetes Mother      Current Outpatient Medications:    atorvastatin  (LIPITOR) 40 MG tablet, TAKE 1 TABLET(40 MG) BY MOUTH DAILY (Patient not taking: Reported on 01/05/2023), Disp: 90 tablet, Rfl: 1   hydrochlorothiazide  (HYDRODIURIL ) 25 MG tablet, TAKE 1 TABLET(25 MG) BY MOUTH DAILY, Disp: 90 tablet, Rfl: 0   Allergies  Allergen Reactions   Pollen Extract     Watery Eyes & Runny Nose     Review of Systems  Neurological:  Positive for headaches.     There  were no vitals filed for this visit. There is no height or weight on file to calculate BMI.  Wt Readings from Last 3 Encounters:  01/05/23 165 lb (74.8 kg)  08/30/22 163 lb 6.4 oz (74.1 kg)  05/19/22 163 lb (73.9 kg)    The 10-year ASCVD risk score (Arnett DK, et al., 2019) is: 9%   Values used to calculate the score:     Age: 45 years     Sex: Female     Is Non-Hispanic African American: Yes     Diabetic: No     Tobacco smoker: No     Systolic Blood Pressure: 102 mmHg     Is BP treated: Yes     HDL Cholesterol: 30 mg/dL     Total Cholesterol: 228 mg/dL  Objective:  Physical Exam      Assessment And Plan:  Essential hypertension  Mixed hyperlipidemia  Prediabetes    No follow-ups on file.  Patient was given opportunity to ask questions. Patient verbalized understanding of the plan and was able to repeat key elements of the plan. All questions were answered to their satisfaction.    Inge Mangle, [name], have reviewed all documentation for this visit. The documentation on 07/05/23 for the exam, diagnosis, procedures, and orders are all accurate and complete.   IF YOU HAVE  BEEN REFERRED TO A SPECIALIST, IT MAY TAKE 1-2 WEEKS TO SCHEDULE/PROCESS THE REFERRAL. IF YOU HAVE NOT HEARD FROM US /SPECIALIST IN TWO WEEKS, PLEASE GIVE US  A CALL AT [phone].

## 2023-07-06 ENCOUNTER — Encounter: Payer: Self-pay | Admitting: Nurse Practitioner

## 2023-07-06 ENCOUNTER — Ambulatory Visit: Payer: Medicare Other | Admitting: Nurse Practitioner

## 2023-07-06 VITALS — BP 130/78 | HR 77 | Temp 98.7°F | Ht 63.0 in | Wt 163.4 lb

## 2023-07-06 DIAGNOSIS — R7303 Prediabetes: Secondary | ICD-10-CM | POA: Diagnosis not present

## 2023-07-06 DIAGNOSIS — J302 Other seasonal allergic rhinitis: Secondary | ICD-10-CM

## 2023-07-06 DIAGNOSIS — Z2821 Immunization not carried out because of patient refusal: Secondary | ICD-10-CM

## 2023-07-06 DIAGNOSIS — E663 Overweight: Secondary | ICD-10-CM | POA: Diagnosis not present

## 2023-07-06 DIAGNOSIS — I10 Essential (primary) hypertension: Secondary | ICD-10-CM | POA: Diagnosis not present

## 2023-07-06 DIAGNOSIS — E782 Mixed hyperlipidemia: Secondary | ICD-10-CM | POA: Diagnosis not present

## 2023-07-06 DIAGNOSIS — Z6828 Body mass index (BMI) 28.0-28.9, adult: Secondary | ICD-10-CM

## 2023-07-06 MED ORDER — LORATADINE 10 MG PO TABS: 10.0000 mg | ORAL_TABLET | Freq: Every day | ORAL | 1 refills | Status: AC

## 2023-07-06 MED ORDER — HYDROCHLOROTHIAZIDE 25 MG PO TABS: ORAL_TABLET | ORAL | 0 refills | Status: DC

## 2023-07-06 NOTE — Patient Instructions (Signed)
 Please message us  if the Claritin is not helpful for your allergies

## 2023-07-07 LAB — BMP8+EGFR
BUN/Creatinine Ratio: 15 (ref 12–28)
BUN: 13 mg/dL (ref 8–27)
CO2: 23 mmol/L (ref 20–29)
Calcium: 9.7 mg/dL (ref 8.7–10.3)
Chloride: 100 mmol/L (ref 96–106)
Creatinine, Ser: 0.86 mg/dL (ref 0.57–1.00)
Glucose: 84 mg/dL (ref 70–99)
Potassium: 3.8 mmol/L (ref 3.5–5.2)
Sodium: 141 mmol/L (ref 134–144)
eGFR: 72 mL/min/{1.73_m2} (ref >59)

## 2023-07-07 LAB — LIPID PANEL
Chol/HDL Ratio: 7.8 ratio — ABNORMAL HIGH (ref 0.0–4.4)
Cholesterol, Total: 227 mg/dL — ABNORMAL HIGH (ref 100–199)
HDL: 29 mg/dL — ABNORMAL LOW (ref >39)
LDL Chol Calc (NIH): 168 mg/dL — ABNORMAL HIGH (ref 0–99)
Triglycerides: 161 mg/dL — ABNORMAL HIGH (ref 0–149)
VLDL Cholesterol Cal: 30 mg/dL (ref 5–40)

## 2023-07-07 LAB — HEMOGLOBIN A1C
Est. average glucose Bld gHb Est-mCnc: 126 mg/dL
Hgb A1c MFr Bld: 6 % — ABNORMAL HIGH (ref 4.8–5.6)

## 2023-07-13 ENCOUNTER — Encounter: Payer: Self-pay | Admitting: Nurse Practitioner

## 2023-07-14 ENCOUNTER — Ambulatory Visit: Payer: Self-pay | Admitting: Nurse Practitioner

## 2023-07-14 NOTE — Assessment & Plan Note (Signed)
 B/P is fairly controlled  BMP ordered to check renal function.

## 2023-07-14 NOTE — Assessment & Plan Note (Signed)
 No current medications, she has chosen not to take statin due to concerns about side effects, cholesterol level is stable.

## 2023-07-14 NOTE — Assessment & Plan Note (Signed)

## 2023-07-14 NOTE — Assessment & Plan Note (Signed)
Chronic, stable. No current medications.

## 2023-07-14 NOTE — Assessment & Plan Note (Signed)
 Declines shingrix, educated on disease process and is aware if he changes his mind to notify office

## 2023-10-02 ENCOUNTER — Other Ambulatory Visit: Payer: Self-pay | Admitting: Nurse Practitioner

## 2023-10-02 DIAGNOSIS — I10 Essential (primary) hypertension: Secondary | ICD-10-CM

## 2024-01-01 ENCOUNTER — Other Ambulatory Visit: Payer: Self-pay | Admitting: Nurse Practitioner

## 2024-01-01 DIAGNOSIS — I10 Essential (primary) hypertension: Secondary | ICD-10-CM

## 2024-01-04 ENCOUNTER — Ambulatory Visit: Payer: Self-pay | Admitting: Nurse Practitioner

## 2024-01-25 ENCOUNTER — Ambulatory Visit: Payer: Self-pay | Admitting: Nurse Practitioner

## 2024-02-01 ENCOUNTER — Encounter: Payer: Self-pay | Admitting: Nurse Practitioner

## 2024-02-01 ENCOUNTER — Ambulatory Visit (INDEPENDENT_AMBULATORY_CARE_PROVIDER_SITE_OTHER): Admitting: Nurse Practitioner

## 2024-02-01 VITALS — BP 130/80 | HR 72 | Temp 98.7°F | Ht 63.0 in | Wt 164.4 lb

## 2024-02-01 DIAGNOSIS — E663 Overweight: Secondary | ICD-10-CM

## 2024-02-01 DIAGNOSIS — K429 Umbilical hernia without obstruction or gangrene: Secondary | ICD-10-CM | POA: Diagnosis not present

## 2024-02-01 DIAGNOSIS — E2839 Other primary ovarian failure: Secondary | ICD-10-CM | POA: Diagnosis not present

## 2024-02-01 DIAGNOSIS — E782 Mixed hyperlipidemia: Secondary | ICD-10-CM

## 2024-02-01 DIAGNOSIS — Z1231 Encounter for screening mammogram for malignant neoplasm of breast: Secondary | ICD-10-CM | POA: Diagnosis not present

## 2024-02-01 DIAGNOSIS — Z79899 Other long term (current) drug therapy: Secondary | ICD-10-CM

## 2024-02-01 DIAGNOSIS — Z0001 Encounter for general adult medical examination with abnormal findings: Secondary | ICD-10-CM | POA: Diagnosis not present

## 2024-02-01 DIAGNOSIS — I10 Essential (primary) hypertension: Secondary | ICD-10-CM | POA: Diagnosis not present

## 2024-02-01 DIAGNOSIS — Z6829 Body mass index (BMI) 29.0-29.9, adult: Secondary | ICD-10-CM | POA: Diagnosis not present

## 2024-02-01 DIAGNOSIS — R7303 Prediabetes: Secondary | ICD-10-CM | POA: Diagnosis not present

## 2024-02-01 DIAGNOSIS — Z0000 Encounter for general adult medical examination without abnormal findings: Secondary | ICD-10-CM

## 2024-02-01 DIAGNOSIS — Z2821 Immunization not carried out because of patient refusal: Secondary | ICD-10-CM

## 2024-02-01 LAB — LIPID PANEL
Chol/HDL Ratio: 7.5 ratio — ABNORMAL HIGH (ref 0.0–4.4)
Cholesterol, Total: 226 mg/dL — ABNORMAL HIGH (ref 100–199)
HDL: 30 mg/dL — ABNORMAL LOW (ref >39)
LDL Chol Calc (NIH): 173 mg/dL — ABNORMAL HIGH (ref 0–99)
Triglycerides: 127 mg/dL (ref 0–149)
VLDL Cholesterol Cal: 23 mg/dL (ref 5–40)

## 2024-02-01 LAB — CBC WITH DIFFERENTIAL/PLATELET
Basophils Absolute: 0 x10E3/uL (ref 0.0–0.2)
Basos: 1 %
EOS (ABSOLUTE): 0.2 x10E3/uL (ref 0.0–0.4)
Eos: 3 %
Hematocrit: 42.9 % (ref 34.0–46.6)
Hemoglobin: 13.4 g/dL (ref 11.1–15.9)
Immature Grans (Abs): 0 x10E3/uL (ref 0.0–0.1)
Immature Granulocytes: 0 %
Lymphocytes Absolute: 2.1 x10E3/uL (ref 0.7–3.1)
Lymphs: 36 %
MCH: 26.9 pg (ref 26.6–33.0)
MCHC: 31.2 g/dL — ABNORMAL LOW (ref 31.5–35.7)
MCV: 86 fL (ref 79–97)
Monocytes Absolute: 0.3 x10E3/uL (ref 0.1–0.9)
Monocytes: 5 %
Neutrophils Absolute: 3.3 x10E3/uL (ref 1.4–7.0)
Neutrophils: 55 %
Platelets: 175 x10E3/uL (ref 150–450)
RBC: 4.99 x10E6/uL (ref 3.77–5.28)
RDW: 13.1 % (ref 11.7–15.4)
WBC: 5.9 x10E3/uL (ref 3.4–10.8)

## 2024-02-01 LAB — CMP14+EGFR
ALT: 18 IU/L (ref 0–32)
AST: 19 IU/L (ref 0–40)
Albumin: 4.3 g/dL (ref 3.8–4.8)
Alkaline Phosphatase: 76 IU/L (ref 49–135)
BUN/Creatinine Ratio: 19 (ref 12–28)
BUN: 14 mg/dL (ref 8–27)
Bilirubin Total: 0.5 mg/dL (ref 0.0–1.2)
CO2: 21 mmol/L (ref 20–29)
Calcium: 9.1 mg/dL (ref 8.7–10.3)
Chloride: 105 mmol/L (ref 96–106)
Creatinine, Ser: 0.72 mg/dL (ref 0.57–1.00)
Globulin, Total: 2.2 g/dL (ref 1.5–4.5)
Glucose: 99 mg/dL (ref 70–99)
Potassium: 4 mmol/L (ref 3.5–5.2)
Sodium: 141 mmol/L (ref 134–144)
Total Protein: 6.5 g/dL (ref 6.0–8.5)
eGFR: 89 mL/min/1.73 (ref >59)

## 2024-02-01 LAB — POCT URINALYSIS DIP (CLINITEK)
Bilirubin, UA: NEGATIVE
Blood, UA: NEGATIVE
Glucose, UA: 100 mg/dL — AB
Ketones, POC UA: NEGATIVE mg/dL
Leukocytes, UA: NEGATIVE
Nitrite, UA: NEGATIVE
POC PROTEIN,UA: NEGATIVE
Spec Grav, UA: 1.02 (ref 1.010–1.025)
Urobilinogen, UA: 0.2 U/dL
pH, UA: 6.5 (ref 5.0–8.0)

## 2024-02-01 LAB — HEMOGLOBIN A1C
Est. average glucose Bld gHb Est-mCnc: 126 mg/dL
Hgb A1c MFr Bld: 6 % — ABNORMAL HIGH (ref 4.8–5.6)

## 2024-02-01 NOTE — Progress Notes (Signed)
 LILLETTE Helen Ada, [name],acting as a neurosurgeon for Helen Ada, [name].,have documented all relevant documentation on the behalf of Helen Ada, [name],as directed by  Helen Ada, [name] while in the presence of Helen Ada, [name].  Subjective:    Patient ID: [name] , female    DOB: [birth year] , 73 y.o.   MRN: [id number]  Chief Complaint  Patient presents with   Hypertension    Patient presents today for a bp and chol follow up, Patient reports compliance with medication. Patient denies any chest pain, SOB, or headaches. Patient has no concerns today.     Here for HM and Bp f/u  Hypertension This is a chronic problem. The current episode started more than 1 year ago. The problem is controlled. Pertinent negatives include no headaches.    Discussed the use of AI scribe software for clinical note transcription with the patient, who gave verbal consent to proceed.  History of Present Illness HIROMI [name] is a 73 year old female who presents for a routine follow-up visit.  She has maintained her weight at 164 pounds, consistent with her weight in May and November of the previous year. Since December 1st, she has been working with a psychologist, educational and has lost 8 pounds. She attributes her weight maintenance to regular exercise and dietary changes, including reduced snacking and limited fast food intake.  She continues to work full-time, 40 hours a week, and plans to retire next year. She exercises regularly, walking four to five days a week for about 25 minutes each session, and has been incorporating stretching into her routine to maintain mobility.  She mentions a history of a hernia that occasionally causes soreness but notes that it 'goes down a little.' No swelling in her feet or ankles, constipation, or diarrhea. She reports regular bowel movements every morning.  She has not had a mammogram recently due to scheduling issues.  Past Medical History:  Diagnosis Date   Arthritis    shoulder -  no meds   Hyperlipidemia    Hypertension    SVD (spontaneous vaginal delivery)    x 5     Family History  Problem Relation Age of Onset   Diabetes Mother     Current Medications[1]   Allergies[2]    The patient states she uses status post hysterectomy for birth control. No LMP recorded. Patient has had a hysterectomy.. Negative for Dysmenorrhea and Negative for Menorrhagia. Negative for: breast discharge, breast lump(s), breast pain and breast self exam. Associated symptoms include abnormal vaginal bleeding. Pertinent negatives include abnormal bleeding (hematology), anxiety, decreased libido, depression, difficulty falling sleep, dyspareunia, history of infertility, nocturia, sexual dysfunction, sleep disturbances, urinary incontinence, urinary urgency, vaginal discharge and vaginal itching. Diet regular; sometimes will cook and sometimes will not.The patient states her exercise level is minimal with walking 4 days a week for about 25 minutes    . The patient's tobacco use is: Tobacco Use History[3]. She has been exposed to passive smoke. The patient's alcohol use is:  Social History   Substance and Sexual Activity  Alcohol Use Not Currently   Comment: ocassionally    Review of Systems  Constitutional: Negative.  Negative for fatigue.  HENT: Negative.    Eyes: Negative.   Respiratory: Negative.  Negative for cough.   Cardiovascular: Negative.   Gastrointestinal: Negative.   Endocrine: Negative for polydipsia, polyphagia and polyuria.  Genitourinary: Negative.   Musculoskeletal: Negative.   Skin: Negative.   Neurological:  Negative for dizziness and headaches.  Hematological: Negative.   Psychiatric/Behavioral:  Negative for sleep disturbance.      Today's Vitals   02/01/24 1007  BP: 130/80  Pulse: 72  Temp: 98.7 F (37.1 C)  TempSrc: Oral  Weight: 164 lb 6.4 oz (74.6 kg)  Height: 5' 3 (1.6 m)  PainSc: 0-No pain   Body mass index is 29.12 kg/m.  Wt Readings  from Last 3 Encounters:  02/01/24 164 lb 6.4 oz (74.6 kg)  07/06/23 163 lb 6.4 oz (74.1 kg)  01/05/23 165 lb (74.8 kg)     Objective:  Physical Exam Vitals and nursing note reviewed.  Constitutional:      General: She is not in acute distress.    Appearance: Normal appearance. She is well-developed.  HENT:     Head: Normocephalic and atraumatic.     Right Ear: Hearing, tympanic membrane, ear canal and external ear normal. There is no impacted cerumen.     Left Ear: Hearing, tympanic membrane, ear canal and external ear normal. There is no impacted cerumen.     Nose: Nose normal.     Mouth/Throat:     Mouth: Mucous membranes are moist.  Eyes:     General: Lids are normal.     Extraocular Movements: Extraocular movements intact.     Conjunctiva/sclera: Conjunctivae normal.     Pupils: Pupils are equal, round, and reactive to light.     Funduscopic exam:    Right eye: No papilledema.        Left eye: No papilledema.  Neck:     Thyroid : No thyroid  mass.     Vascular: No carotid bruit.  Cardiovascular:     Rate and Rhythm: Normal rate and regular rhythm.     Pulses: Normal pulses.     Heart sounds: Normal heart sounds. No murmur heard. Pulmonary:     Effort: Pulmonary effort is normal. No respiratory distress.     Breath sounds: Normal breath sounds. No wheezing.  Abdominal:     General: Abdomen is flat. Bowel sounds are normal. There is no distension.     Palpations: Abdomen is soft.     Tenderness: There is no abdominal tenderness.  Genitourinary:    Rectum: Guaiac result negative.  Musculoskeletal:        General: No swelling. Normal range of motion.     Cervical back: Full passive range of motion without pain, normal range of motion and neck supple.     Right lower leg: No edema.     Left lower leg: No edema.  Skin:    General: Skin is warm and dry.     Capillary Refill: Capillary refill takes less than 2 seconds.  Neurological:     General: No focal deficit  present.     Mental Status: She is alert and oriented to person, place, and time.     Cranial Nerves: No cranial nerve deficit.     Sensory: No sensory deficit.  Psychiatric:        Mood and Affect: Mood normal.        Behavior: Behavior normal.        Thought Content: Thought content normal.        Judgment: Judgment normal.         Assessment And Plan:     Essential hypertension Assessment & Plan: Blood pressure is well-controlled at 130/80 mmHg with current medication regimen. - Continue current antihypertensive medication regimen.  Orders: -     CMP14+EGFR -  EKG 12-Lead -     POCT URINALYSIS DIP (CLINITEK) -     Microalbumin / creatinine urine ratio  Mixed hyperlipidemia Assessment & Plan: No current medications, she has chosen not to take statin due to concerns about side effects, cholesterol level is stable.    Orders: -     Lipid panel -     CMP14+EGFR  Prediabetes Assessment & Plan: Chronic, stable.  No current medications  Orders: -     Hemoglobin A1c  Influenza vaccination declined  Herpes zoster vaccination declined  Umbilical hernia without obstruction and without gangrene Assessment & Plan: Presence of abdominal hernia with occasional soreness. Advised monitoring for worsening symptoms. - Monitor for worsening symptoms such as increased pain or hardness. - If symptoms worsen, will consider ultrasound and referral to a surgeon.   Overweight with body mass index (BMI) of 29 to 29.9 in adult Assessment & Plan: BMI is 29, weight stable at 164 lbs. Discussed benefits of losing 10 lbs to improve BMI. - Encouraged continuation of regular exercise and balanced diet. - Discussed potential benefits of losing an additional 10 lbs.   Decreased estrogen level -     DG Bone Density; Future  Other long term (current) drug therapy -     CBC with Differential/Platelet  Encounter for screening mammogram for breast cancer -     3D Screening Mammogram,  Left and Right; Future  Encounter for annual health examination Assessment & Plan: Routine wellness visit with well-controlled blood pressure and stable weight. - Ordered blood work. - Encouraged continuation of regular exercise and balanced diet. - Discussed potential benefits of turmeric or glucosamine for joint health.      Return for 6 month bp check. Patient was given opportunity to ask questions. Patient verbalized understanding of the plan and was able to repeat key elements of the plan. All questions were answered to their satisfaction.   Helen Ada, [name]  I, Helen Ada, [name], have reviewed all documentation for this visit. The documentation on 02/01/2024 for the exam, diagnosis, procedures, and orders are all accurate and complete.      [1]  Current Outpatient Medications:    hydrochlorothiazide  (HYDRODIURIL ) 25 MG tablet, TAKE 1 TABLET(25 MG) BY MOUTH DAILY, Disp: 90 tablet, Rfl: 0   loratadine  (CLARITIN ) 10 MG tablet, Take 1 tablet (10 mg total) by mouth daily., Disp: 90 tablet, Rfl: 1   atorvastatin  (LIPITOR) 40 MG tablet, TAKE 1 TABLET(40 MG) BY MOUTH DAILY (Patient not taking: Reported on 02/01/2024), Disp: 90 tablet, Rfl: 1 [2]  Allergies Allergen Reactions   Pollen Extract     Watery Eyes & Runny Nose  [3]  Social History Tobacco Use  Smoking Status Never  Smokeless Tobacco Never

## 2024-02-03 LAB — MICROALBUMIN / CREATININE URINE RATIO
Creatinine, Urine: 167.8 mg/dL
Microalb/Creat Ratio: 5 mg/g{creat} (ref 0–29)
Microalbumin, Urine: 9.1 ug/mL

## 2024-02-11 ENCOUNTER — Ambulatory Visit: Payer: Self-pay | Admitting: Nurse Practitioner

## 2024-02-11 DIAGNOSIS — K429 Umbilical hernia without obstruction or gangrene: Secondary | ICD-10-CM | POA: Insufficient documentation

## 2024-02-11 DIAGNOSIS — Z0000 Encounter for general adult medical examination without abnormal findings: Secondary | ICD-10-CM | POA: Insufficient documentation

## 2024-02-11 NOTE — Assessment & Plan Note (Signed)
 BMI is 29, weight stable at 164 lbs. Discussed benefits of losing 10 lbs to improve BMI. - Encouraged continuation of regular exercise and balanced diet. - Discussed potential benefits of losing an additional 10 lbs.

## 2024-02-11 NOTE — Assessment & Plan Note (Signed)
 Presence of abdominal hernia with occasional soreness. Advised monitoring for worsening symptoms. - Monitor for worsening symptoms such as increased pain or hardness. - If symptoms worsen, will consider ultrasound and referral to a surgeon.

## 2024-02-11 NOTE — Assessment & Plan Note (Signed)
 No current medications, she has chosen not to take statin due to concerns about side effects, cholesterol level is stable.

## 2024-02-11 NOTE — Assessment & Plan Note (Signed)
Chronic, stable. No current medications.

## 2024-02-11 NOTE — Assessment & Plan Note (Signed)
 Blood pressure is well-controlled at 130/80 mmHg with current medication regimen. - Continue current antihypertensive medication regimen.

## 2024-02-11 NOTE — Assessment & Plan Note (Signed)
 Routine wellness visit with well-controlled blood pressure and stable weight. - Ordered blood work. - Encouraged continuation of regular exercise and balanced diet. - Discussed potential benefits of turmeric or glucosamine for joint health.

## 2024-02-29 ENCOUNTER — Ambulatory Visit

## 2024-03-13 ENCOUNTER — Ambulatory Visit: Payer: Self-pay

## 2024-03-13 VITALS — Ht 63.0 in | Wt 162.0 lb

## 2024-03-13 DIAGNOSIS — Z0000 Encounter for general adult medical examination without abnormal findings: Secondary | ICD-10-CM

## 2024-03-13 NOTE — Progress Notes (Addendum)
 "  Chief Complaint  Patient presents with   Medicare Wellness     Subjective:   [name] is a 74 y.o. female who presents for a Medicare Annual Wellness Visit.  Visit info / Clinical Intake: Medicare Wellness Visit Type:: Subsequent Annual Wellness Visit Persons participating in visit and providing information:: patient Medicare Wellness Visit Mode:: Telephone If telephone:: video declined Since this visit was completed virtually, some vitals may be partially provided or unavailable. Missing vitals are due to the limitations of the virtual format.: Documented vitals are patient reported If Telephone or Video please confirm:: I connected with patient using audio/video enable telemedicine. I verified patient identity with two identifiers, discussed telehealth limitations, and patient agreed to proceed. Patient Location:: home Provider Location:: office Interpreter Needed?: No Pre-visit prep was completed: yes AWV questionnaire completed by patient prior to visit?: no Living arrangements:: (!) lives alone Patient's Overall Health Status Rating: good Typical amount of pain: none Does pain affect daily life?: no Are you currently prescribed opioids?: no  Dietary Habits and Nutritional Risks How many meals a day?: 2 Eats fruit and vegetables daily?: yes Most meals are obtained by: preparing own meals; eating out In the last 2 weeks, have you had any of the following?: none Diabetic:: no  Functional Status Activities of Daily Living (to include ambulation/medication): Independent Ambulation: Independent Medication Administration: Independent Home Management (perform basic housework or laundry): Independent Manage your own finances?: yes Primary transportation is: driving Concerns about vision?: no *vision screening is required for WTM* Concerns about hearing?: no  Fall Screening Falls in the past year?: 0 Number of falls in past year: 0 Was there an injury with Fall?:  0 Fall Risk Category Calculator: 0 Patient Fall Risk Level: Low Fall Risk  Fall Risk Patient at Risk for Falls Due to: Medication side effect Fall risk Follow up: Falls prevention discussed; Education provided; Falls evaluation completed  Home and Transportation Safety: All rugs have non-skid backing?: (!) no All stairs or steps have railings?: yes Grab bars in the bathtub or shower?: yes Have non-skid surface in bathtub or shower?: yes Good home lighting?: yes Regular seat belt use?: yes Hospital stays in the last year:: no  Cognitive Assessment Difficulty concentrating, remembering, or making decisions? : no Will 6CIT or Mini Cog be Completed: yes What year is it?: 0 points What month is it?: 0 points Give patient an address phrase to remember (5 components): [address] [place] About what time is it?: 0 points Count backwards from 20 to 1: 0 points Say the months of the year in reverse: 0 points Repeat the address phrase from earlier: 0 points 6 CIT Score: 0 points  Advance Directives (For Healthcare) Does Patient Have a Medical Advance Directive?: Yes Type of Advance Directive: Healthcare Power of [place]; Living will Copy of Healthcare Power of Attorney in Chart?: No - copy requested Copy of Living Will in Chart?: No - copy requested  Reviewed/Updated  Reviewed/Updated: Reviewed All (Medical, Surgical, Family, Medications, Allergies, Care Teams, Patient Goals)    Allergies (verified) Pollen extract   Current Medications (verified) Outpatient Encounter Medications as of 03/13/2024  Medication Sig   hydrochlorothiazide  (HYDRODIURIL ) 25 MG tablet TAKE 1 TABLET(25 MG) BY MOUTH DAILY   loratadine  (CLARITIN ) 10 MG tablet Take 1 tablet (10 mg total) by mouth daily.   atorvastatin  (LIPITOR) 40 MG tablet TAKE 1 TABLET(40 MG) BY MOUTH DAILY (Patient not taking: Reported on 03/13/2024)   No facility-administered encounter medications on file  as of 03/13/2024.     History: Past Medical History:  Diagnosis Date   Arthritis    shoulder - no meds   Hyperlipidemia    Hypertension    SVD (spontaneous vaginal delivery)    x 5   Past Surgical History:  Procedure Laterality Date   COLONOSCOPY     CYSTOCELE REPAIR N/A 04/01/2013   Procedure: ANTERIOR REPAIR (CYSTOCELE) with Enterocele Repair;  Surgeon: Charlie JINNY Flowers, MD;  Location: [facility];  Service: Gynecology;  Laterality: N/A;  90 min.   RECTOCELE REPAIR N/A 04/01/2013   Procedure: POSTERIOR REPAIR (RECTOCELE);  Surgeon: Charlie JINNY Flowers, MD;  Location: [facility];  Service: Gynecology;  Laterality: N/A;   TUBAL LIGATION     VAGINAL HYSTERECTOMY N/A 04/01/2013   Procedure: HYSTERECTOMY TOTAL VAGINAL;  Surgeon: Charlie JINNY Flowers, MD;  Location: [facility];  Service: Gynecology;  Laterality: N/A;   WISDOM TOOTH EXTRACTION     Family History  Problem Relation Age of Onset   Diabetes Mother    Social History   Occupational History   Not on file  Tobacco Use   Smoking status: Never   Smokeless tobacco: Never  Vaping Use   Vaping status: Never Used  Substance and Sexual Activity   Alcohol use: Not Currently    Comment: ocassionally   Drug use: No   Sexual activity: Not Currently    Birth control/protection: Surgical   Tobacco Counseling Counseling given: Not Answered  SDOH Screenings   Food Insecurity: No Food Insecurity (03/13/2024)  Housing: Unknown (03/13/2024)  Transportation Needs: No Transportation Needs (03/13/2024)  Utilities: Not At Risk (03/13/2024)  Alcohol Screen: Low Risk (03/13/2024)  Depression (PHQ2-9): Low Risk (03/13/2024)  Financial Resource Strain: Low Risk (03/13/2024)  Physical Activity: Sufficiently Active (03/13/2024)  Social Connections: Moderately Isolated (03/13/2024)  Stress: No Stress Concern Present (03/13/2024)  Tobacco Use: Low Risk (03/13/2024)  Health Literacy: Adequate Health Literacy (03/13/2024)   See flowsheets for full screening details  Depression  Screen PHQ 2 & 9 Depression Scale- Over the past 2 weeks, how often have you been bothered by any of the following problems? Little interest or pleasure in doing things: 0 Feeling down, depressed, or hopeless (PHQ Adolescent also includes...irritable): 0 PHQ-2 Total Score: 0 Trouble falling or staying asleep, or sleeping too much: 0 Feeling tired or having little energy: 0 Poor appetite or overeating (PHQ Adolescent also includes...weight loss): 0 Feeling bad about yourself - or that you are a failure or have let yourself or your family down: 0 Trouble concentrating on things, such as reading the newspaper or watching television (PHQ Adolescent also includes...like school work): 0 Moving or speaking so slowly that other people could have noticed. Or the opposite - being so fidgety or restless that you have been moving around a lot more than usual: 0 Thoughts that you would be better off dead, or of hurting yourself in some way: 0 PHQ-9 Total Score: 0 If you checked off any problems, how difficult have these problems made it for you to do your work, take care of things at home, or get along with other people?: Not difficult at all     Goals Addressed               This Visit's Progress     wants to lose 10-15 pounds (pt-stated)               Objective:    Today's Vitals   03/13/24 1559  Weight: 162  lb (73.5 kg)  Height: 5' 3 (1.6 m)   Body mass index is 28.7 kg/m.  Hearing/Vision screen Vision Screening - Comments:: Regular eye exams Immunizations and Health Maintenance Health Maintenance  Topic Date Due   Bone Density Scan  Never done   Mammogram  10/07/2017   COVID-19 Vaccine (3 - Pfizer risk series) 02/09/2020   Zoster Vaccines- Shingrix (1 of 2) 05/01/2024 (Originally 03/24/1969)   Influenza Vaccine  05/14/2024 (Originally 09/15/2023)   Medicare Annual Wellness (AWV)  03/13/2025   DTaP/Tdap/Td (3 - Td or Tdap) 10/24/2028   Colonoscopy  07/01/2031   Pneumococcal  Vaccine: 50+ Years  Completed   Hepatitis C Screening  Completed   Meningococcal B Vaccine  Aged Out        Assessment/Plan:  This is a routine wellness examination for Gear[place]ene.  Patient Care Team: Georgina Speaks, [name] as PCP - General (General Practice) [place], [facility] [place], [place] as Referring Physician (Optometry)  I have personally reviewed and noted the following in the patients chart:   Medical and social history Use of alcohol, tobacco or illicit drugs  Current medications and supplements including opioid prescriptions. Functional ability and status Nutritional status Physical activity Advanced directives List of other physicians Hospitalizations, surgeries, and ER visits in previous 12 months Vitals Screenings to include cognitive, depression, and falls Referrals and appointments  No orders of the defined types were placed in this encounter.  In addition, I have reviewed and discussed with patient certain preventive protocols, quality metrics, and best practice recommendations. A written personalized care plan for preventive services as well as general preventive health recommendations were provided to patient.   Ardella FORBES Dawn, LPN   [id number]/[id number]   Return in 1 year (on 03/13/2025).  After Visit Summary: (Pick Up) Due to this being a telephonic visit, with patients personalized plan was offered to patient and patient has requested to Pick up at office.  Nurse Notes: Vaccines not given: covid declined today HM Addressed: mammogram and dexa was ordered by Janece on 02/01/2024  "

## 2024-03-13 NOTE — Patient Instructions (Signed)
 [name],  Thank you for taking the time for your Medicare Wellness Visit. I appreciate your continued commitment to your health goals. Please review the care plan we discussed, and feel free to reach out if I can assist you further.  Please note that Annual Wellness Visits do not include a physical exam. Some assessments may be limited, especially if the visit was conducted virtually. If needed, we may recommend an in-person follow-up with your provider.  Ongoing Care Seeing your primary care provider every 3 to 6 months helps us  monitor your health and provide consistent, personalized care.   Referrals If a referral was made during today's visit and you haven't received any updates within two weeks, please contact the referred provider directly to check on the status.  Recommended Screenings:  Health Maintenance  Topic Date Due   Osteoporosis screening with Bone Density Scan  Never done   Breast Cancer Screening  10/07/2017   COVID-19 Vaccine (3 - Pfizer risk series) 02/09/2020   Medicare Annual Wellness Visit  09/22/2023   Zoster (Shingles) Vaccine (1 of 2) 05/01/2024*   Flu Shot  05/14/2024*   DTaP/Tdap/Td vaccine (3 - Td or Tdap) 10/24/2028   Colon Cancer Screening  07/01/2031   Pneumococcal Vaccine for age over 35  Completed   Hepatitis C Screening  Completed   Meningitis B Vaccine  Aged Out  *Topic was postponed. The date shown is not the original due date.       03/13/2024    4:06 PM  Advanced Directives  Does Patient Have a Medical Advance Directive? Yes  Type of Estate Agent of [place];Living will  Copy of Healthcare Power of Attorney in Chart? No - copy requested    Vision: Annual vision screenings are recommended for early detection of glaucoma, cataracts, and diabetic retinopathy. These exams can also reveal signs of chronic conditions such as diabetes and high blood pressure.  Dental: Annual dental screenings help detect early signs of  oral cancer, gum disease, and other conditions linked to overall health, including heart disease and diabetes.  Please see the attached documents for additional preventive care recommendations.

## 2024-08-01 ENCOUNTER — Ambulatory Visit: Payer: Self-pay | Admitting: Nurse Practitioner

## 2025-03-26 ENCOUNTER — Ambulatory Visit: Payer: Self-pay
